# Patient Record
Sex: Male | Born: 1975 | Race: White | Hispanic: No | Marital: Married | Smoking: Never smoker
Health system: Southern US, Community
[De-identification: ages and names within clinical notes are randomized; demographics above are authoritative.]

## PROBLEM LIST (undated history)

## (undated) DIAGNOSIS — G43909 Migraine, unspecified, not intractable, without status migrainosus: Secondary | ICD-10-CM

## (undated) DIAGNOSIS — E785 Hyperlipidemia, unspecified: Secondary | ICD-10-CM

## (undated) DIAGNOSIS — F419 Anxiety disorder, unspecified: Secondary | ICD-10-CM

## (undated) HISTORY — PX: COLONOSCOPY: SHX174

## (undated) HISTORY — DX: Hyperlipidemia, unspecified: E78.5

## (undated) HISTORY — DX: Anxiety disorder, unspecified: F41.9

## (undated) HISTORY — DX: Migraine, unspecified, not intractable, without status migrainosus: G43.909

---

## 2007-07-29 HISTORY — PX: CARDIAC CATHETERIZATION: SHX172

## 2013-01-07 ENCOUNTER — Ambulatory Visit (INDEPENDENT_AMBULATORY_CARE_PROVIDER_SITE_OTHER): Payer: 59 | Admitting: Internal Medicine

## 2013-01-07 ENCOUNTER — Encounter: Payer: Self-pay | Admitting: Internal Medicine

## 2013-01-07 VITALS — BP 110/70 | HR 61 | Temp 98.4°F | Resp 20 | Ht 73.5 in | Wt 236.0 lb

## 2013-01-07 DIAGNOSIS — Z0000 Encounter for general adult medical examination without abnormal findings: Secondary | ICD-10-CM

## 2013-01-07 MED ORDER — PROPRANOLOL HCL 20 MG PO TABS: ORAL_TABLET | ORAL | Status: DC

## 2013-01-07 MED ORDER — ALPRAZOLAM 0.5 MG PO TABS: 0.5000 mg | ORAL_TABLET | Freq: Every evening | ORAL | Status: DC | PRN

## 2013-01-07 NOTE — Progress Notes (Signed)
Subjective:    Patient ID: Edward Nash, male    DOB: March 15, 1976, 37 y.o.   MRN: [id number]  HPI  37 year old patient who is seen today to establish with our practice. He has enjoyed excellent health and takes no chronic medications.  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 In 2009 he was living in [place]  for 3 years and had a cardiac evaluation included a cardiac catheterization. This was normal. In April of 2013 the patient had an EKG that revealed inverted T waves in the lateral precordial leads. This was evaluated with a stress echo with normal findings. The patient remains reactive including full court basketball without limitations.  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 He was born in [place] but moved to [place] at [age] year of age. His parents are retired in [place].  Family history both parents are age 103 and in good health mother has hypercholesterolemia. 2 brothers age 36 and 66 in good health    Review of Systems  Constitutional: Negative for fever, chills, activity change, appetite change and fatigue.  HENT: Negative for hearing loss, ear pain, congestion, rhinorrhea, sneezing, mouth sores, trouble swallowing, neck pain, neck stiffness, dental problem, voice change, sinus pressure and tinnitus.   Eyes: Negative for photophobia, pain, redness and visual disturbance.  Respiratory: Negative for apnea, cough, choking, chest tightness, shortness of breath and wheezing.   Cardiovascular: Negative for chest pain, palpitations and leg swelling.  Gastrointestinal: Negative for nausea, vomiting, abdominal pain, diarrhea, constipation, blood in stool, abdominal distention, anal bleeding and rectal pain.  Genitourinary: Negative for dysuria, urgency, frequency, hematuria, flank pain, decreased urine volume, discharge, penile swelling, scrotal swelling, difficulty urinating, genital sores and testicular pain.   Musculoskeletal: Negative for myalgias, back pain, joint swelling, arthralgias and gait problem.  Skin: Negative for color change, rash and wound.  Neurological: Negative for dizziness, tremors, seizures, syncope, facial asymmetry, speech difficulty, weakness, light-headedness, numbness and headaches.  Hematological: Negative for adenopathy. Does not bruise/bleed easily.  Psychiatric/Behavioral: Negative for suicidal ideas, hallucinations, behavioral problems, confusion, sleep disturbance, self-injury, dysphoric mood, decreased concentration and agitation. The patient is not nervous/anxious.        Objective:   Physical Exam  Constitutional: He appears well-developed and well-nourished.  HENT:  Head: Normocephalic and atraumatic.  Right Ear: External ear normal.  Left Ear: External ear normal.  Nose: Nose normal.  Mouth/Throat: Oropharynx is clear and moist.  Eyes: Conjunctivae and EOM are normal. Pupils are equal, round, and reactive to light. No scleral icterus.  Neck: Normal range of motion. Neck supple. No JVD present. No thyromegaly present.  Cardiovascular: Regular rhythm, normal heart sounds and intact distal pulses.  Exam reveals no gallop and no friction rub.   No murmur heard. Pulmonary/Chest: Effort normal and breath sounds normal. He exhibits no tenderness.  Abdominal: Soft. Bowel sounds are normal. He exhibits no distension and no mass. There is no tenderness.  Genitourinary: Prostate normal and penis normal.  Musculoskeletal: Normal range of motion. He exhibits no edema and no tenderness.  Lymphadenopathy:    He has no cervical adenopathy.  Neurological: He is alert. He has normal reflexes. No cranial nerve deficit. Coordination normal.  Skin: Skin is warm and dry. No rash noted.  Psychiatric: He has a normal mood and affect. His behavior is normal.          Assessment & Plan:   Preventive health examination. Performance anxiety. The patient has  used alprazolam  as needed in the past. Will refill this prescription. The patient will also give a call of propranolol  Return here in one or 2 years or as needed

## 2013-01-07 NOTE — Patient Instructions (Addendum)
It is important that you exercise regularly, at least 20 minutes 3 to 4 times per week.  If you develop chest pain or shortness of breath seek  medical attention.Preventive Care for Adults, Male A healthy lifestyle and preventive care can promote health and wellness. Preventive health guidelines for men include the following key practices:  A routine yearly physical is a good way to check with your caregiver about your health and preventative screening. It is a chance to share any concerns and updates on your health, and to receive a thorough exam.  Visit your dentist for a routine exam and preventative care every 6 months. Brush your teeth twice a day and floss once a day. Good oral hygiene prevents tooth decay and gum disease.  The frequency of eye exams is based on your age, health, family medical history, use of contact lenses, and other factors. Follow your caregiver's recommendations for frequency of eye exams.  Eat a healthy diet. Foods like vegetables, fruits, whole grains, low-fat dairy products, and lean protein foods contain the nutrients you need without too many calories. Decrease your intake of foods high in solid fats, added sugars, and salt. Eat the right amount of calories for you.Get information about a proper diet from your caregiver, if necessary.  Regular physical exercise is one of the most important things you can do for your health. Most adults should get at least 150 minutes of moderate-intensity exercise (any activity that increases your heart rate and causes you to sweat) each week. In addition, most adults need muscle-strengthening exercises on 2 or more days a week.  Maintain a healthy weight. The body mass index (BMI) is a screening tool to identify possible weight problems. It provides an estimate of body fat based on height and weight. Your caregiver can help determine your BMI, and can help you achieve or maintain a healthy weight.For adults 20 years and older:  A  BMI below 18.5 is considered underweight.  A BMI of 18.5 to 24.9 is normal.  A BMI of 25 to 29.9 is considered overweight.  A BMI of 30 and above is considered obese.  Maintain normal blood lipids and cholesterol levels by exercising and minimizing your intake of saturated fat. Eat a balanced diet with plenty of fruit and vegetables. Blood tests for lipids and cholesterol should begin at age 2 and be repeated every 5 years. If your lipid or cholesterol levels are high, you are over 50, or you are a high risk for heart disease, you may need your cholesterol levels checked more frequently.Ongoing high lipid and cholesterol levels should be treated with medicines if diet and exercise are not effective.  If you smoke, find out from your caregiver how to quit. If you do not use tobacco, do not start.  If you choose to drink alcohol, do not exceed 2 drinks per day. One drink is considered to be 12 ounces (355 mL) of beer, 5 ounces (148 mL) of wine, or 1.5 ounces (44 mL) of liquor.  Avoid use of street drugs. Do not share needles with anyone. Ask for help if you need support or instructions about stopping the use of drugs.  High blood pressure causes heart disease and increases the risk of stroke. Your blood pressure should be checked at least every 1 to 2 years. Ongoing high blood pressure should be treated with medicines, if weight loss and exercise are not effective.  If you are 43 to 39 years old, ask your caregiver if you  should take aspirin to prevent heart disease.  Diabetes screening involves taking a blood sample to check your fasting blood sugar level. This should be done once every 3 years, after age 29, if you are within normal weight and without risk factors for diabetes. Testing should be considered at a younger age or be carried out more frequently if you are overweight and have at least 1 risk factor for diabetes.  Colorectal cancer can be detected and often prevented. Most routine  colorectal cancer screening begins at the age of 77 and continues through age 28. However, your caregiver may recommend screening at an earlier age if you have risk factors for colon cancer. On a yearly basis, your caregiver may provide home test kits to check for hidden blood in the stool. Use of a small camera at the end of a tube, to directly examine the colon (sigmoidoscopy or colonoscopy), can detect the earliest forms of colorectal cancer. Talk to your caregiver about this at age 75, when routine screening begins. Direct examination of the colon should be repeated every 5 to 10 years through age 54, unless early forms of pre-cancerous polyps or small growths are found.  Hepatitis C blood testing is recommended for all people born from [birth year] through 1965 and any individual with known risks for hepatitis C.  Practice safe sex. Use condoms and avoid high-risk sexual practices to reduce the spread of sexually transmitted infections (STIs). STIs include gonorrhea, chlamydia, syphilis, trichomonas, herpes, HPV, and human immunodeficiency virus (HIV). Herpes, HIV, and HPV are viral illnesses that have no cure. They can result in disability, cancer, and death.  A one-time screening for abdominal aortic aneurysm (AAA) and surgical repair of large AAAs by sound wave imaging (ultrasonography) is recommended for ages 29 to 71 years who are current or former smokers.  Healthy men should no longer receive prostate-specific antigen (PSA) blood tests as part of routine cancer screening. Consult with your caregiver about prostate cancer screening.  Testicular cancer screening is not recommended for adult males who have no symptoms. Screening includes self-exam, caregiver exam, and other screening tests. Consult with your caregiver about any symptoms you have or any concerns you have about testicular cancer.  Use sunscreen with skin protection factor (SPF) of 30 or more. Apply sunscreen liberally and repeatedly  throughout the day. You should seek shade when your shadow is shorter than you. Protect yourself by wearing long sleeves, pants, a wide-brimmed hat, and sunglasses year round, whenever you are outdoors.  Once a month, do a whole body skin exam, using a mirror to look at the skin on your back. Notify your caregiver of new moles, moles that have irregular borders, moles that are larger than a pencil eraser, or moles that have changed in shape or color.  Stay current with required immunizations.  Influenza. You need a dose every fall (or winter). The composition of the flu vaccine changes each year, so being vaccinated once is not enough.  Pneumococcal polysaccharide. You need 1 to 2 doses if you smoke cigarettes or if you have certain chronic medical conditions. You need 1 dose at age 36 (or older) if you have never been vaccinated.  Tetanus, diphtheria, pertussis (Tdap, Td). Get 1 dose of Tdap vaccine if you are younger than age 44 years, are over 32 and have contact with an infant, are a Research scientist (physical sciences), or simply want to be protected from whooping cough. After that, you need a Td booster dose every 10 years. Consult your  caregiver if you have not had at least 3 tetanus and diphtheria-containing shots sometime in your life or have a deep or dirty wound.  HPV. This vaccine is recommended for males 13 through 39 years of age. This vaccine may be given to men 22 through 39 years of age who have not completed the 3 dose series. It is recommended for men through age 63 who have sex with men or whose immune system is weakened because of HIV infection, other illness, or medications. The vaccine is given in 3 doses over 6 months.  Measles, mumps, rubella (MMR). You need at least 1 dose of MMR if you were born in 1957 or later. You may also need a 2nd dose.  Meningococcal. If you are age 3 to 46 years and a Orthoptist living in a residence hall, or have one of several medical conditions,  you need to get vaccinated against meningococcal disease. You may also need additional booster doses.  Zoster (shingles). If you are age 31 years or older, you should get this vaccine.  Varicella (chickenpox). If you have never had chickenpox or you were vaccinated but received only 1 dose, talk to your caregiver to find out if you need this vaccine.  Hepatitis A. You need this vaccine if you have a specific risk factor for hepatitis A virus infection, or you simply wish to be protected from this disease. The vaccine is usually given as 2 doses, 6 to 18 months apart.  Hepatitis B. You need this vaccine if you have a specific risk factor for hepatitis B virus infection or you simply wish to be protected from this disease. The vaccine is given in 3 doses, usually over 6 months. Preventative Service / Frequency Ages 36 to 84  Blood pressure check.** / Every 1 to 2 years.  Lipid and cholesterol check.** / Every 5 years beginning at age 28.  Hepatitis C blood test.** / For any individual with known risks for hepatitis C.  Skin self-exam. / Monthly.  Influenza immunization.** / Every year.  Pneumococcal polysaccharide immunization.** / 1 to 2 doses if you smoke cigarettes or if you have certain chronic medical conditions.  Tetanus, diphtheria, pertussis (Tdap,Td) immunization. / A one-time dose of Tdap vaccine. After that, you need a Td booster dose every 10 years.  HPV immunization. / 3 doses over 6 months, if 26 and younger.  Measles, mumps, rubella (MMR) immunization. / You need at least 1 dose of MMR if you were born in 1957 or later. You may also need a 2nd dose.  Meningococcal immunization. / 1 dose if you are age 37 to 74 years and a Orthoptist living in a residence hall, or have one of several medical conditions, you need to get vaccinated against meningococcal disease. You may also need additional booster doses.  Varicella immunization.** / Consult your  caregiver.  Hepatitis A immunization.** / Consult your caregiver. 2 doses, 6 to 18 months apart.  Hepatitis B immunization.** / Consult your caregiver. 3 doses usually over 6 months. Ages 39 to 62  Blood pressure check.** / Every 1 to 2 years.  Lipid and cholesterol check.** / Every 5 years beginning at age 71.  Fecal occult blood test (FOBT) of stool. / Every year beginning at age 34 and continuing until age 75. You may not have to do this test if you get colonoscopy every 10 years.  Flexible sigmoidoscopy** or colonoscopy.** / Every 5 years for a flexible sigmoidoscopy or every  10 years for a colonoscopy beginning at age 3 and continuing until age 33.  Hepatitis C blood test.** / For all people born from [birth year] through 1965 and any individual with known risks for hepatitis C.  Skin self-exam. / Monthly.  Influenza immunization.** / Every year.  Pneumococcal polysaccharide immunization.** / 1 to 2 doses if you smoke cigarettes or if you have certain chronic medical conditions.  Tetanus, diphtheria, pertussis (Tdap/Td) immunization.** / A one-time dose of Tdap vaccine. After that, you need a Td booster dose every 10 years.  Measles, mumps, rubella (MMR) immunization. / You need at least 1 dose of MMR if you were born in 1957 or later. You may also need a 2nd dose.  Varicella immunization.**/ Consult your caregiver.  Meningococcal immunization.** / Consult your caregiver.  Hepatitis A immunization.** / Consult your caregiver. 2 doses, 6 to 18 months apart.  Hepatitis B immunization.** / Consult your caregiver. 3 doses, usually over 6 months. Ages 4 and over  Blood pressure check.** / Every 1 to 2 years.  Lipid and cholesterol check.**/ Every 5 years beginning at age 32.  Fecal occult blood test (FOBT) of stool. / Every year beginning at age 53 and continuing until age 70. You may not have to do this test if you get colonoscopy every 10 years.  Flexible sigmoidoscopy** or  colonoscopy.** / Every 5 years for a flexible sigmoidoscopy or every 10 years for a colonoscopy beginning at age 40 and continuing until age 65.  Hepatitis C blood test.** / For all people born from [birth year] through 1965 and any individual with known risks for hepatitis C.  Abdominal aortic aneurysm (AAA) screening.** / A one-time screening for ages 50 to 8 years who are current or former smokers.  Skin self-exam. / Monthly.  Influenza immunization.** / Every year.  Pneumococcal polysaccharide immunization.** / 1 dose at age 84 (or older) if you have never been vaccinated.  Tetanus, diphtheria, pertussis (Tdap, Td) immunization. / A one-time dose of Tdap vaccine if you are over 65 and have contact with an infant, are a Research scientist (physical sciences), or simply want to be protected from whooping cough. After that, you need a Td booster dose every 10 years.  Varicella immunization. ** / Consult your caregiver.  Meningococcal immunization.** / Consult your caregiver.  Hepatitis A immunization. ** / Consult your caregiver. 2 doses, 6 to 18 months apart.  Hepatitis B immunization.** / Check with your caregiver. 3 doses, usually over 6 months. **Family history and personal history of risk and conditions may change your caregiver's recommendations. Document Released: 09/09/2001 Document Revised: 10/06/2011 Document Reviewed: 12/09/2010 [facility] Patient Information 2014 [place], [place].

## 2013-08-19 ENCOUNTER — Encounter: Payer: Self-pay | Admitting: Internal Medicine

## 2013-08-19 ENCOUNTER — Ambulatory Visit (INDEPENDENT_AMBULATORY_CARE_PROVIDER_SITE_OTHER): Payer: BC Managed Care – PPO | Admitting: Internal Medicine

## 2013-08-19 VITALS — BP 120/80 | HR 64 | Temp 98.8°F | Resp 20 | Ht 73.5 in | Wt 235.0 lb

## 2013-08-19 DIAGNOSIS — E785 Hyperlipidemia, unspecified: Secondary | ICD-10-CM

## 2013-08-19 DIAGNOSIS — Z23 Encounter for immunization: Secondary | ICD-10-CM

## 2013-08-19 DIAGNOSIS — B009 Herpesviral infection, unspecified: Secondary | ICD-10-CM

## 2013-08-19 LAB — LIPID PANEL
Cholesterol: 210 mg/dL — ABNORMAL HIGH (ref 0–200)
HDL: 51.7 mg/dL (ref >39.00)
Total CHOL/HDL Ratio: 4
Triglycerides: 78 mg/dL (ref 0.0–149.0)
VLDL: 15.6 mg/dL (ref 0.0–40.0)

## 2013-08-19 LAB — LDL CHOLESTEROL, DIRECT: Direct LDL: 145.6 mg/dL

## 2013-08-19 MED ORDER — PROPRANOLOL HCL 20 MG PO TABS: ORAL_TABLET | ORAL | Status: DC

## 2013-08-19 NOTE — Patient Instructions (Signed)
It is important that you exercise regularly, at least 20 minutes 3 to 4 times per week.  If you develop chest pain or shortness of breath seek  medical attention.  Call or return to clinic prn if these symptoms worsen or fail to improve as anticipated.  

## 2013-08-19 NOTE — Progress Notes (Signed)
   Subjective:    Patient ID: Edward Nash, male    DOB: 04/21/1976, 38 y.o.   MRN: [id number]  HPI  38 year old patient was concern about a possible herpetic lesion involving his right lower lateral lip. His wife is 8 months pregnant. No prior history of known herpetic lesions. He also has a history of mild dyslipidemia and is requesting followup.  Past Medical History  Diagnosis Date  . Migraines     History   Social History  . Marital Status: Married    Spouse Name: N/A    Number of Children: N/A  . Years of Education: N/A   Occupational History  . Not on file.   Social History Main Topics  . Smoking status: Never Smoker   . Smokeless tobacco: Former NeurosurgeonUser    Types: Chew  . Alcohol Use: 0.6 oz/week    1 Cans of beer per week  . Drug Use: No  . Sexual Activity: Not on file   Other Topics Concern  . Not on file   Social History Narrative  . No narrative on file    History reviewed. No pertinent past surgical history.  No family history on file.  Allergies  Allergen Reactions  . Sulfa Antibiotics Rash    Current Outpatient Prescriptions on File Prior to Visit  Medication Sig Dispense Refill  . acetaminophen (TYLENOL) 500 MG tablet Take 1,000 mg by mouth every 6 (six) hours as needed for pain.       No current facility-administered medications on file prior to visit.    BP 120/80  Pulse 64  Temp(Src) 98.8 F (37.1 C) (Oral)  Resp 20  Ht 6' 1.5" (1.867 m)  Wt 235 lb (106.595 kg)  BMI 30.58 kg/m2  SpO2 98%       Review of Systems  Skin: Positive for wound.       Objective:   Physical Exam  Constitutional: He appears well-developed and well-nourished. No distress.  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 The benefits and drawbacks of testing discussed. Dyslipidemia. We'll check a followup lipid profile.

## 2013-08-19 NOTE — Progress Notes (Signed)
Pre-visit discussion using our clinic review tool. No additional management support is needed unless otherwise documented below in the visit note.  

## 2013-08-20 ENCOUNTER — Other Ambulatory Visit: Payer: Self-pay | Admitting: Internal Medicine

## 2013-08-23 ENCOUNTER — Other Ambulatory Visit: Payer: Self-pay | Admitting: Internal Medicine

## 2013-08-23 NOTE — Telephone Encounter (Signed)
Dr.K, pt wanting HSV results.

## 2013-09-11 ENCOUNTER — Emergency Department (INDEPENDENT_AMBULATORY_CARE_PROVIDER_SITE_OTHER)
Admission: EM | Admit: 2013-09-11 | Discharge: 2013-09-11 | Disposition: A | Discharge status: Home / Self Care | Payer: BC Managed Care – PPO | Source: Home / Self Care | Attending: Family Medicine | Admitting: Family Medicine

## 2013-09-11 ENCOUNTER — Emergency Department (HOSPITAL_COMMUNITY)
Admission: EM | Admit: 2013-09-11 | Discharge: 2013-09-11 | Disposition: A | Discharge status: Home / Self Care | Payer: BC Managed Care – PPO | Source: Home / Self Care | Attending: Family Medicine | Admitting: Family Medicine

## 2013-09-11 ENCOUNTER — Encounter (HOSPITAL_COMMUNITY): Payer: Self-pay | Admitting: Emergency Medicine

## 2013-09-11 DIAGNOSIS — G43909 Migraine, unspecified, not intractable, without status migrainosus: Secondary | ICD-10-CM

## 2013-09-11 MED ORDER — SUMATRIPTAN SUCCINATE 6 MG/0.5ML ~~LOC~~ SOLN
6.0000 mg | Freq: Once | SUBCUTANEOUS | Status: AC
  Administered 2013-09-11: 6 mg via SUBCUTANEOUS

## 2013-09-11 MED ORDER — KETOROLAC TROMETHAMINE 60 MG/2ML IM SOLN
60.0000 mg | Freq: Once | INTRAMUSCULAR | Status: AC
  Administered 2013-09-11: 60 mg via INTRAMUSCULAR

## 2013-09-11 MED ORDER — DEXAMETHASONE SODIUM PHOSPHATE 10 MG/ML IJ SOLN
10.0000 mg | Freq: Once | INTRAMUSCULAR | Status: AC
  Administered 2013-09-11: 10 mg via INTRAMUSCULAR

## 2013-09-11 MED ORDER — METOCLOPRAMIDE HCL 5 MG/ML IJ SOLN
5.0000 mg | Freq: Once | INTRAMUSCULAR | Status: AC
  Administered 2013-09-11: 5 mg via INTRAMUSCULAR

## 2013-09-11 MED ORDER — DEXAMETHASONE SODIUM PHOSPHATE 10 MG/ML IJ SOLN
INTRAMUSCULAR | Status: AC
  Filled 2013-09-11: qty 1

## 2013-09-11 MED ORDER — SUMATRIPTAN SUCCINATE 6 MG/0.5ML ~~LOC~~ SOLN
SUBCUTANEOUS | Status: AC
  Filled 2013-09-11: qty 0.5

## 2013-09-11 MED ORDER — KETOROLAC TROMETHAMINE 60 MG/2ML IM SOLN
INTRAMUSCULAR | Status: AC
  Filled 2013-09-11: qty 2

## 2013-09-11 MED ORDER — METOCLOPRAMIDE HCL 5 MG/ML IJ SOLN
INTRAMUSCULAR | Status: AC
  Filled 2013-09-11: qty 2

## 2013-09-11 NOTE — ED Notes (Signed)
C/o migraine States he usually get migraines 3-4 times a year States he would like a shot of medication

## 2013-09-11 NOTE — Discharge Instructions (Signed)
Thank you for coming in today. Follow up with your doctor.  Go to the emergency room if your headache becomes excruciating or you have weakness or numbness or uncontrolled vomiting.   Migraine Headache A migraine headache is an intense, throbbing pain on one or both sides of your head. A migraine can last for 30 minutes to several hours. CAUSES  The exact cause of a migraine headache is not always known. However, a migraine may be caused when nerves in the brain become irritated and release chemicals that cause inflammation. This causes pain. Certain things may also trigger migraines, such as:  Alcohol.  Smoking.  Stress.  Menstruation.  Aged cheeses.  Foods or drinks that contain nitrates, glutamate, aspartame, or tyramine.  Lack of sleep.  Chocolate.  Caffeine.  Hunger.  Physical exertion.  Fatigue.  Medicines used to treat chest pain (nitroglycerine), birth control pills, estrogen, and some blood pressure medicines. SIGNS AND SYMPTOMS  Pain on one or both sides of your head.  Pulsating or throbbing pain.  Severe pain that prevents daily activities.  Pain that is aggravated by any physical activity.  Nausea, vomiting, or both.  Dizziness.  Pain with exposure to bright lights, loud noises, or activity.  General sensitivity to bright lights, loud noises, or smells. Before you get a migraine, you may get warning signs that a migraine is coming (aura). An aura may include:  Seeing flashing lights.  Seeing bright spots, halos, or zig-zag lines.  Having tunnel vision or blurred vision.  Having feelings of numbness or tingling.  Having trouble talking.  Having muscle weakness. DIAGNOSIS  A migraine headache is often diagnosed based on:  Symptoms.  Physical exam.  A CT scan or MRI of your head. These imaging tests cannot diagnose migraines, but they can help rule out other causes of headaches. TREATMENT Medicines may be given for pain and nausea.  Medicines can also be given to help prevent recurrent migraines.  HOME CARE INSTRUCTIONS  Only take over-the-counter or prescription medicines for pain or discomfort as directed by your health care provider. The use of long-term narcotics is not recommended.  Lie down in a dark, quiet room when you have a migraine.  Keep a journal to find out what may trigger your migraine headaches. For example, write down:  What you eat and drink.  How much sleep you get.  Any change to your diet or medicines.  Limit alcohol consumption.  Quit smoking if you smoke.  Get 7 9 hours of sleep, or as recommended by your health care provider.  Limit stress.  Keep lights dim if bright lights bother you and make your migraines worse. SEEK IMMEDIATE MEDICAL CARE IF:   Your migraine becomes severe.  You have a fever.  You have a stiff neck.  You have vision loss.  You have muscular weakness or loss of muscle control.  You start losing your balance or have trouble walking.  You feel faint or pass out.  You have severe symptoms that are different from your first symptoms. MAKE SURE YOU:   Understand these instructions.  Will watch your condition.  Will get help right away if you are not doing well or get worse. Document Released: 07/14/2005 Document Revised: 05/04/2013 Document Reviewed: 03/21/2013 [facility] Patient Information 2014 [place], [place].

## 2013-09-11 NOTE — ED Provider Notes (Signed)
Edward Nash is a 38 y.o. male who presents to Urgent Care today for migraine headache. Patient has a history of migraines. He developed headache today about 5 PM. Initial visual aura now he notes right-sided pounding headache. This is consistent with prior episodes of migraine. He denies any weakness numbness difficulty walking or dizziness. He has had injections for migraines in the past and would like one of possible. He has not tried any medications yet. His pain is moderate. He typically gets migraines 3-4 times a year.   Past Medical History  Diagnosis Date  . Migraines    History  Substance Use Topics  . Smoking status: Never Smoker   . Smokeless tobacco: Former NeurosurgeonUser    Types: Chew  . Alcohol Use: 0.6 oz/week    1 Cans of beer per week   ROS as above Medications: Current Facility-Administered Medications  Medication Dose Route Frequency Provider Last Rate Last Dose  . dexamethasone (DECADRON) injection 10 mg  10 mg Intramuscular Once Rodolph BongEvan S Devun Anna, MD      . ketorolac (TORADOL) injection 60 mg  60 mg Intramuscular Once Rodolph BongEvan S Lynsi Dooner, MD      . metoCLOPramide (REGLAN) injection 5 mg  5 mg Intramuscular Once Rodolph BongEvan S Dujuan Stankowski, MD      . SUMAtriptan (IMITREX) injection 6 mg  6 mg Subcutaneous Once Rodolph BongEvan S Gracelin Weisberg, MD       Current Outpatient Prescriptions  Medication Sig Dispense Refill  . acetaminophen (TYLENOL) 500 MG tablet Take 1,000 mg by mouth every 6 (six) hours as needed for pain.      Marland Kitchen. propranolol (INDERAL) 20 MG tablet TAKE 1 TABLET BY MOUTH EVERY DAY AS DIRECTED  90 tablet  1    Exam:  BP 120/80  Pulse 52  Temp(Src) 98.6 F (37 C) (Oral)  Resp 18  SpO2 100% Gen: Well NAD HEENT: EOMI,  MMM PERRLA Lungs: Normal work of breathing. CTABL Heart: RRR no MRG Abd: NABS, Soft. NT, ND Exts: Brisk capillary refill, warm and well perfused.  Neuro: Alert and oriented normal coordination balance sensation and gait   Assessment and Plan: 38 y.o. male with migraine headache.   Plan to treat with IM Toradol, dexamethasone, Reglan, and subcutaneous Imitrex.  Followup with primary care provider  Discussed warning signs or symptoms. Please see discharge instructions. Patient expresses understanding.    Rodolph BongEvan S Kassi Esteve, MD 09/11/13 [phone]

## 2013-10-20 ENCOUNTER — Telehealth: Payer: Self-pay | Admitting: Internal Medicine

## 2013-10-20 NOTE — Telephone Encounter (Signed)
Please give contact number for [facility]

## 2013-10-20 NOTE — Telephone Encounter (Signed)
Please advise 

## 2013-10-20 NOTE — Telephone Encounter (Signed)
Spoke to pt gave him number for [facility], told him he can contact them for an appointment. Pt verbalized understanding.

## 2013-10-20 NOTE — Telephone Encounter (Signed)
Pt would like a referral to anger management. Pt did not elaborate on situation. Pt has bcbs

## 2013-10-27 ENCOUNTER — Ambulatory Visit: Payer: BC Managed Care – PPO | Admitting: Licensed Clinical Social Worker

## 2013-10-31 ENCOUNTER — Telehealth: Payer: Self-pay | Admitting: Internal Medicine

## 2013-10-31 NOTE — Telephone Encounter (Signed)
[facility] - [place], [place] - [address] is requesting re-fill propranolol (INDERAL) 20 MG tablet

## 2013-11-01 MED ORDER — PROPRANOLOL HCL 20 MG PO TABS: ORAL_TABLET | ORAL | Status: DC

## 2013-11-01 NOTE — Telephone Encounter (Signed)
Rx sent 

## 2013-11-02 ENCOUNTER — Ambulatory Visit (INDEPENDENT_AMBULATORY_CARE_PROVIDER_SITE_OTHER): Payer: BC Managed Care – PPO | Admitting: Licensed Clinical Social Worker

## 2013-11-02 DIAGNOSIS — F331 Major depressive disorder, recurrent, moderate: Secondary | ICD-10-CM

## 2013-11-28 ENCOUNTER — Ambulatory Visit: Payer: BC Managed Care – PPO | Admitting: Licensed Clinical Social Worker

## 2013-12-01 ENCOUNTER — Encounter: Payer: Self-pay | Admitting: Internal Medicine

## 2013-12-01 ENCOUNTER — Ambulatory Visit (INDEPENDENT_AMBULATORY_CARE_PROVIDER_SITE_OTHER): Payer: BC Managed Care – PPO | Admitting: Licensed Clinical Social Worker

## 2013-12-01 ENCOUNTER — Encounter: Payer: Self-pay | Admitting: Licensed Clinical Social Worker

## 2013-12-01 VITALS — BP 120/78

## 2013-12-01 DIAGNOSIS — F4323 Adjustment disorder with mixed anxiety and depressed mood: Secondary | ICD-10-CM

## 2013-12-01 DIAGNOSIS — F331 Major depressive disorder, recurrent, moderate: Secondary | ICD-10-CM

## 2013-12-01 MED ORDER — SILDENAFIL CITRATE 100 MG PO TABS: 50.0000 mg | ORAL_TABLET | Freq: Every day | ORAL | Status: DC | PRN

## 2013-12-01 MED ORDER — SERTRALINE HCL 50 MG PO TABS
50.0000 mg | ORAL_TABLET | Freq: Every day | ORAL | Status: DC
Start: 2013-12-01 — End: 2013-12-02

## 2013-12-01 NOTE — Progress Notes (Signed)
   Subjective:    Patient ID: Edward Nash, Edward Nash    DOB: 09/24/1975, 38 y.o.   MRN: [id number]  HPI  38 year old patient who was seen today as a work in.  Patient was seen in followup by behavioral health and was referred for consideration of antidepressant therapy.  Patient states that approximately 6 months ago.  Patient was seen by a psychologist and considered for an antidepressant.  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  His main concerns regard to anger issues.  He feels that he is easily angered and has frequent outbursts that interfere with interpersonal relationships.  He describes some chronic anxiety.  He also describes some ED issues and at times.  The inability to maintain an erection    Review of Systems     Objective:   Physical Exam        Assessment & Plan:   Options discussed.  Will start on sertraline 50 mg daily.  We'll watch her closely for any worsening of sexual dysfunction.  We'll give a trial of Viagra.  Samples of Cialis also dispensed.  He states that he has had a testosterone level checked in the past and have  always been normal

## 2013-12-01 NOTE — Patient Instructions (Signed)
Return in 6 weeks for follow up

## 2013-12-02 ENCOUNTER — Telehealth: Payer: Self-pay | Admitting: Internal Medicine

## 2013-12-02 MED ORDER — SERTRALINE HCL 50 MG PO TABS: 50.0000 mg | ORAL_TABLET | Freq: Every day | ORAL | Status: DC

## 2013-12-02 NOTE — Telephone Encounter (Addendum)
Pt needs generic zoloft 50 mg #90 w.refills [facility]. [facility]

## 2013-12-02 NOTE — Telephone Encounter (Signed)
Done, pt aware.

## 2013-12-02 NOTE — Telephone Encounter (Signed)
Spoke to pt told him Rx for Zoloft was sent to [facility]. [facility]. Pt said yes. Told him [facility]. Pt verbalized understanding. Rx sent.

## 2013-12-15 ENCOUNTER — Ambulatory Visit: Payer: BC Managed Care – PPO | Admitting: Licensed Clinical Social Worker

## 2013-12-23 ENCOUNTER — Ambulatory Visit (INDEPENDENT_AMBULATORY_CARE_PROVIDER_SITE_OTHER): Payer: BC Managed Care – PPO | Admitting: Licensed Clinical Social Worker

## 2013-12-23 DIAGNOSIS — F331 Major depressive disorder, recurrent, moderate: Secondary | ICD-10-CM

## 2014-01-12 ENCOUNTER — Telehealth: Payer: Self-pay | Admitting: Internal Medicine

## 2014-01-12 NOTE — Telephone Encounter (Signed)
Dr. Kirtland BouchardK, please see message and advise if okay to increase Sertraline to 100 mg ?

## 2014-01-12 NOTE — Telephone Encounter (Signed)
Okay to increase sertraline to 100 mg daily.  Okay to call in new prescription

## 2014-01-12 NOTE — Telephone Encounter (Signed)
Pt req rx on sertraline (ZOLOFT) 50 MG tablet  He said Judithe ModestSusan Bond suggested he go to 100mg  and this is [facility] is [facility]  [address] [facility] [address]

## 2014-01-13 MED ORDER — SERTRALINE HCL 100 MG PO TABS: 100.0000 mg | ORAL_TABLET | Freq: Every day | ORAL | Status: DC

## 2014-01-13 NOTE — Telephone Encounter (Signed)
Spoke to pt told him sorry did not get My Chart message but okay to increase Sertraline to 100 mg. Pt verbalized understanding and asked [facility]. Told pt okay will send Rx. Rx sent.

## 2014-06-21 ENCOUNTER — Other Ambulatory Visit: Payer: Self-pay | Admitting: Internal Medicine

## 2014-07-25 ENCOUNTER — Other Ambulatory Visit: Payer: Self-pay | Admitting: *Deleted

## 2014-07-25 MED ORDER — SERTRALINE HCL 100 MG PO TABS: ORAL_TABLET | ORAL | Status: DC

## 2014-10-27 ENCOUNTER — Other Ambulatory Visit: Payer: Self-pay | Admitting: Internal Medicine

## 2014-12-22 ENCOUNTER — Other Ambulatory Visit: Payer: Self-pay | Admitting: Internal Medicine

## 2015-01-12 ENCOUNTER — Other Ambulatory Visit: Payer: Self-pay | Admitting: Internal Medicine

## 2015-02-16 ENCOUNTER — Other Ambulatory Visit: Payer: Self-pay | Admitting: Internal Medicine

## 2015-02-28 ENCOUNTER — Other Ambulatory Visit: Payer: Self-pay | Admitting: Internal Medicine

## 2015-02-28 NOTE — Telephone Encounter (Signed)
Pt request refill of the following: sertraline (ZOLOFT) 100 MG tablet  Pt has a physical scheduled for Sept 2016   [facility]  [address] [place]

## 2015-03-01 MED ORDER — SERTRALINE HCL 100 MG PO TABS: ORAL_TABLET | ORAL | Status: DC

## 2015-03-01 NOTE — Telephone Encounter (Signed)
Rx sent 

## 2015-04-19 ENCOUNTER — Other Ambulatory Visit (INDEPENDENT_AMBULATORY_CARE_PROVIDER_SITE_OTHER): Payer: BLUE CROSS/BLUE SHIELD

## 2015-04-19 DIAGNOSIS — Z0000 Encounter for general adult medical examination without abnormal findings: Secondary | ICD-10-CM

## 2015-04-19 LAB — BASIC METABOLIC PANEL
BUN: 17 mg/dL (ref 6–23)
CO2: 31 mEq/L (ref 19–32)
Calcium: 9.7 mg/dL (ref 8.4–10.5)
Chloride: 104 mEq/L (ref 96–112)
Creatinine, Ser: 0.87 mg/dL (ref 0.40–1.50)
GFR: 102.76 mL/min (ref >60.00)
Glucose, Bld: 100 mg/dL — ABNORMAL HIGH (ref 70–99)
Potassium: 4.6 mEq/L (ref 3.5–5.1)
Sodium: 142 mEq/L (ref 135–145)

## 2015-04-19 LAB — POCT URINALYSIS DIPSTICK
Bilirubin, UA: NEGATIVE
Blood, UA: NEGATIVE
Glucose, UA: NEGATIVE
Ketones, UA: NEGATIVE
Leukocytes, UA: NEGATIVE
Nitrite, UA: NEGATIVE
Protein, UA: NEGATIVE
Spec Grav, UA: 1.025
Urobilinogen, UA: 0.2
pH, UA: 6.5

## 2015-04-19 LAB — CBC WITH DIFFERENTIAL/PLATELET
Basophils Absolute: 0 10*3/uL (ref 0.0–0.1)
Basophils Relative: 0.6 % (ref 0.0–3.0)
Eosinophils Absolute: 0.1 10*3/uL (ref 0.0–0.7)
Eosinophils Relative: 1.5 % (ref 0.0–5.0)
HCT: 44.1 % (ref 39.0–52.0)
Hemoglobin: 15 g/dL (ref 13.0–17.0)
Lymphocytes Relative: 23.6 % (ref 12.0–46.0)
Lymphs Abs: 1.5 10*3/uL (ref 0.7–4.0)
MCHC: 34 g/dL (ref 30.0–36.0)
MCV: 88.4 fl (ref 78.0–100.0)
Monocytes Absolute: 0.5 10*3/uL (ref 0.1–1.0)
Monocytes Relative: 7.4 % (ref 3.0–12.0)
Neutro Abs: 4.3 10*3/uL (ref 1.4–7.7)
Neutrophils Relative %: 66.9 % (ref 43.0–77.0)
Platelets: 196 10*3/uL (ref 150.0–400.0)
RBC: 4.99 Mil/uL (ref 4.22–5.81)
RDW: 13.1 % (ref 11.5–15.5)
WBC: 6.5 10*3/uL (ref 4.0–10.5)

## 2015-04-19 LAB — HEPATIC FUNCTION PANEL
ALT: 18 U/L (ref 0–53)
AST: 17 U/L (ref 0–37)
Albumin: 4.4 g/dL (ref 3.5–5.2)
Alkaline Phosphatase: 42 U/L (ref 39–117)
Bilirubin, Direct: 0.1 mg/dL (ref 0.0–0.3)
Total Bilirubin: 0.5 mg/dL (ref 0.2–1.2)
Total Protein: 7 g/dL (ref 6.0–8.3)

## 2015-04-19 LAB — LIPID PANEL
Cholesterol: 202 mg/dL — ABNORMAL HIGH (ref 0–200)
HDL: 56.9 mg/dL (ref >39.00)
LDL Cholesterol: 128 mg/dL — ABNORMAL HIGH (ref 0–99)
NonHDL: 144.95
Total CHOL/HDL Ratio: 4
Triglycerides: 87 mg/dL (ref 0.0–149.0)
VLDL: 17.4 mg/dL (ref 0.0–40.0)

## 2015-04-19 LAB — TSH: TSH: 1.56 u[IU]/mL (ref 0.35–4.50)

## 2015-04-23 ENCOUNTER — Encounter: Payer: Self-pay | Admitting: Internal Medicine

## 2015-04-23 ENCOUNTER — Ambulatory Visit (INDEPENDENT_AMBULATORY_CARE_PROVIDER_SITE_OTHER): Payer: BLUE CROSS/BLUE SHIELD | Admitting: Internal Medicine

## 2015-04-23 VITALS — BP 120/80 | HR 72 | Temp 99.0°F | Resp 20 | Ht 73.5 in | Wt 240.0 lb

## 2015-04-23 DIAGNOSIS — Z0000 Encounter for general adult medical examination without abnormal findings: Secondary | ICD-10-CM | POA: Diagnosis not present

## 2015-04-23 NOTE — Patient Instructions (Signed)
Health Maintenance A healthy lifestyle and preventative care can promote health and wellness.  Maintain regular health, dental, and eye exams.  Eat a healthy diet. Foods like vegetables, fruits, whole grains, low-fat dairy products, and lean protein foods contain the nutrients you need and are low in calories. Decrease your intake of foods high in solid fats, added sugars, and salt. Get information about a proper diet from your health care Umaiza Matusik, if necessary.  Regular physical exercise is one of the most important things you can do for your health. Most adults should get at least 150 minutes of moderate-intensity exercise (any activity that increases your heart rate and causes you to sweat) each week. In addition, most adults need muscle-strengthening exercises on 2 or more days a week.   Maintain a healthy weight. The body mass index (BMI) is a screening tool to identify possible weight problems. It provides an estimate of body fat based on height and weight. Your health care Anothy Bufano can find your BMI and can help you achieve or maintain a healthy weight. For males 20 years and older:  A BMI below 18.5 is considered underweight.  A BMI of 18.5 to 24.9 is normal.  A BMI of 25 to 29.9 is considered overweight.  A BMI of 30 and above is considered obese.  Maintain normal blood lipids and cholesterol by exercising and minimizing your intake of saturated fat. Eat a balanced diet with plenty of fruits and vegetables. Blood tests for lipids and cholesterol should begin at age 20 and be repeated every 5 years. If your lipid or cholesterol levels are high, you are over age 50, or you are at high risk for heart disease, you may need your cholesterol levels checked more frequently.Ongoing high lipid and cholesterol levels should be treated with medicines if diet and exercise are not working.  If you smoke, find out from your health care Lilleigh Hechavarria how to quit. If you do not use tobacco, do not  start.  Lung cancer screening is recommended for adults aged 55-80 years who are at high risk for developing lung cancer because of a history of smoking. A yearly low-dose CT scan of the lungs is recommended for people who have at least a 30-pack-year history of smoking and are current smokers or have quit within the past 15 years. A pack year of smoking is smoking an average of 1 pack of cigarettes a day for 1 year (for example, a 30-pack-year history of smoking could mean smoking 1 pack a day for 30 years or 2 packs a day for 15 years). Yearly screening should continue until the smoker has stopped smoking for at least 15 years. Yearly screening should be stopped for people who develop a health problem that would prevent them from having lung cancer treatment.  If you choose to drink alcohol, do not have more than 2 drinks per day. One drink is considered to be 12 oz (360 mL) of beer, 5 oz (150 mL) of wine, or 1.5 oz (45 mL) of liquor.  Avoid the use of street drugs. Do not share needles with anyone. Ask for help if you need support or instructions about stopping the use of drugs.  High blood pressure causes heart disease and increases the risk of stroke. Blood pressure should be checked at least every 1-2 years. Ongoing high blood pressure should be treated with medicines if weight loss and exercise are not effective.  If you are 45-79 years old, ask your health care Gionni Vaca if   you should take aspirin to prevent heart disease.  Diabetes screening involves taking a blood sample to check your fasting blood sugar level. This should be done once every 3 years after age 45 if you are at a normal weight and without risk factors for diabetes. Testing should be considered at a younger age or be carried out more frequently if you are overweight and have at least 1 risk factor for diabetes.  Colorectal cancer can be detected and often prevented. Most routine colorectal cancer screening begins at the age of 50  and continues through age 75. However, your health care Walfred Bettendorf may recommend screening at an earlier age if you have risk factors for colon cancer. On a yearly basis, your health care Julene Rahn may provide home test kits to check for hidden blood in the stool. A small camera at the end of a tube may be used to directly examine the colon (sigmoidoscopy or colonoscopy) to detect the earliest forms of colorectal cancer. Talk to your health care Lang Zingg about this at age 50 when routine screening begins. A direct exam of the colon should be repeated every 5-10 years through age 75, unless early forms of precancerous polyps or small growths are found.  People who are at an increased risk for hepatitis B should be screened for this virus. You are considered at high risk for hepatitis B if:  You were born in a country where hepatitis B occurs often. Talk with your health care Taylie Helder about which countries are considered high risk.  Your parents were born in a high-risk country and you have not received a shot to protect against hepatitis B (hepatitis B vaccine).  You have HIV or AIDS.  You use needles to inject street drugs.  You live with, or have sex with, someone who has hepatitis B.  You are a man who has sex with other men (MSM).  You get hemodialysis treatment.  You take certain medicines for conditions like cancer, organ transplantation, and autoimmune conditions.  Hepatitis C blood testing is recommended for all people born from 1945 through 1965 and any individual with known risk factors for hepatitis C.  Healthy men should no longer receive prostate-specific antigen (PSA) blood tests as part of routine cancer screening. Talk to your health care Daylen Hack about prostate cancer screening.  Testicular cancer screening is not recommended for adolescents or adult males who have no symptoms. Screening includes self-exam, a health care Jora Galluzzo exam, and other screening tests. Consult with your  health care Makita Blow about any symptoms you have or any concerns you have about testicular cancer.  Practice safe sex. Use condoms and avoid high-risk sexual practices to reduce the spread of sexually transmitted infections (STIs).  You should be screened for STIs, including gonorrhea and chlamydia if:  You are sexually active and are younger than 24 years.  You are older than 24 years, and your health care Aryanna Shaver tells you that you are at risk for this type of infection.  Your sexual activity has changed since you were last screened, and you are at an increased risk for chlamydia or gonorrhea. Ask your health care Donnamae Muilenburg if you are at risk.  If you are at risk of being infected with HIV, it is recommended that you take a prescription medicine daily to prevent HIV infection. This is called pre-exposure prophylaxis (PrEP). You are considered at risk if:  You are a man who has sex with other men (MSM).  You are a heterosexual man who   is sexually active with multiple partners.  You take drugs by injection.  You are sexually active with a partner who has HIV.  Talk with your health care Leverne Amrhein about whether you are at high risk of being infected with HIV. If you choose to begin PrEP, you should first be tested for HIV. You should then be tested every 3 months for as long as you are taking PrEP.  Use sunscreen. Apply sunscreen liberally and repeatedly throughout the day. You should seek shade when your shadow is shorter than you. Protect yourself by wearing long sleeves, pants, a wide-brimmed hat, and sunglasses year round whenever you are outdoors.  Tell your health care Eeshan Verbrugge of new moles or changes in moles, especially if there is a change in shape or color. Also, tell your health care Avelino Herren if a mole is larger than the size of a pencil eraser.  A one-time screening for abdominal aortic aneurysm (AAA) and surgical repair of large AAAs by ultrasound is recommended for men aged  65-75 years who are current or former smokers.  Stay current with your vaccines (immunizations). Document Released: 01/10/2008 Document Revised: 07/19/2013 Document Reviewed: 12/09/2010 ExitCare Patient Information 2015 [employer]. This information is not intended to replace advice given to you by your health care Brailyn Killion. Make sure you discuss any questions you have with your health care Talaya Lamprecht.  

## 2015-04-23 NOTE — Progress Notes (Signed)
Pre visit review using our clinic review tool, if applicable. No additional management support is needed unless otherwise documented below in the visit note. 

## 2015-04-23 NOTE — Progress Notes (Signed)
Subjective:    Patient ID: Edward Nash, male    DOB: [birth year], 39 y.o.   MRN: [id number]  HPI 64 -year-old patient who is seen today for an annual examination. He has enjoyed excellent health and takes no chronic medications.  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 In 2009 he was living in [place]  for 3 years and had a cardiac evaluation included a cardiac catheterization. This was normal. In April of 2013 the patient had an EKG that revealed inverted T waves in the lateral precordial leads. This was evaluated with a stress echo with normal findings. The patient remains active including full court basketball without limitations.  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 He was born in [place] but moved to [place] at [age] year of age. His parents are retired in [place].  Family history both parents are age 69 and in good health mother has hypercholesterolemia. 2 brothers age 49 and 52 in good health except for obesity  Colonoscopy in [place], [place] 2007 due to bright red rectal bleeding    Review of Systems  Constitutional: Negative for fever, chills, activity change, appetite change and fatigue.  HENT: Negative for congestion, dental problem, ear pain, hearing loss, mouth sores, rhinorrhea, sinus pressure, sneezing, tinnitus, trouble swallowing and voice change.   Eyes: Negative for photophobia, pain, redness and visual disturbance.  Respiratory: Negative for apnea, cough, choking, chest tightness, shortness of breath and wheezing.   Cardiovascular: Negative for chest pain, palpitations and leg swelling.  Gastrointestinal: Negative for nausea, vomiting, abdominal pain, diarrhea, constipation, blood in stool, abdominal distention, anal bleeding and rectal pain.  Genitourinary: Negative for dysuria, urgency, frequency, hematuria, flank pain, decreased urine volume, discharge, penile swelling, scrotal swelling,  difficulty urinating, genital sores and testicular pain.  Musculoskeletal: Negative for myalgias, back pain, joint swelling, arthralgias, gait problem, neck pain and neck stiffness.  Skin: Negative for color change, rash and wound.  Neurological: Negative for dizziness, tremors, seizures, syncope, facial asymmetry, speech difficulty, weakness, light-headedness, numbness and headaches.  Hematological: Negative for adenopathy. Does not bruise/bleed easily.  Psychiatric/Behavioral: Negative for suicidal ideas, hallucinations, behavioral problems, confusion, sleep disturbance, self-injury, dysphoric mood, decreased concentration and agitation. The patient is not nervous/anxious.        Objective:   Physical Exam  Constitutional: He appears well-developed and well-nourished.  HENT:  Head: Normocephalic and atraumatic.  Right Ear: External ear normal.  Left Ear: External ear normal.  Nose: Nose normal.  Mouth/Throat: Oropharynx is clear and moist.  Eyes: Conjunctivae and EOM are normal. Pupils are equal, round, and reactive to light. No scleral icterus.  Neck: Normal range of motion. Neck supple. No JVD present. No thyromegaly present.  Cardiovascular: Regular rhythm, normal heart sounds and intact distal pulses.  Exam reveals no gallop and no friction rub.   No murmur heard. Pulmonary/Chest: Effort normal and breath sounds normal. He exhibits no tenderness.  Abdominal: Soft. Bowel sounds are normal. He exhibits no distension and no mass. There is no tenderness.  Genitourinary: Prostate normal and penis normal.  Musculoskeletal: Normal range of motion. He exhibits no edema or tenderness.  Lymphadenopathy:    He has no cervical adenopathy.  Neurological: He is alert. He has normal reflexes. No cranial nerve deficit. Coordination normal.  Skin: Skin is warm and dry. No rash noted.  Psychiatric: He has a normal mood and affect. His behavior is normal.  Assessment & Plan:    Preventive health examination.  Anxiety disorder.  Continue sertraline  Return here in one or 2 years or as needed

## 2015-05-13 ENCOUNTER — Other Ambulatory Visit: Payer: Self-pay | Admitting: Internal Medicine

## 2015-07-20 ENCOUNTER — Telehealth: Payer: Self-pay | Admitting: Internal Medicine

## 2015-07-20 MED ORDER — TADALAFIL 20 MG PO TABS: 20.0000 mg | ORAL_TABLET | Freq: Every day | ORAL | Status: DC | PRN

## 2015-07-20 NOTE — Telephone Encounter (Signed)
Pt was given a sample of Cialis in past and would like new [facility] [address]. Pt does not remember the mg

## 2015-07-20 NOTE — Telephone Encounter (Signed)
Ok  20 mg  #6  RF 6

## 2015-07-20 NOTE — Telephone Encounter (Signed)
Please advise dosage? Pt said was given sample.

## 2015-07-20 NOTE — Telephone Encounter (Signed)
Pt notified Rx sent to pharmacy

## 2015-10-02 ENCOUNTER — Other Ambulatory Visit: Payer: Self-pay | Admitting: Internal Medicine

## 2015-12-02 ENCOUNTER — Other Ambulatory Visit: Payer: Self-pay | Admitting: Internal Medicine

## 2015-12-21 ENCOUNTER — Encounter: Payer: Self-pay | Admitting: Family Medicine

## 2015-12-21 ENCOUNTER — Ambulatory Visit (INDEPENDENT_AMBULATORY_CARE_PROVIDER_SITE_OTHER): Payer: BLUE CROSS/BLUE SHIELD | Admitting: Family Medicine

## 2015-12-21 VITALS — BP 110/62 | HR 87 | Temp 98.3°F | Ht 73.5 in | Wt 245.1 lb

## 2015-12-21 DIAGNOSIS — J020 Streptococcal pharyngitis: Secondary | ICD-10-CM | POA: Diagnosis not present

## 2015-12-21 MED ORDER — AMOXICILLIN 500 MG PO CAPS: 500.0000 mg | ORAL_CAPSULE | Freq: Two times a day (BID) | ORAL | Status: AC

## 2015-12-21 NOTE — Progress Notes (Signed)
  HPI:  Edward Nash is a pleasant 40 year old here for an acute visit for sore throat. He has added sore throat, fatigue and mildly upset stomach for 2 days. He denies sinus congestion, cough, sneezing, shortness of breath, nausea vomiting or diarrhea. His wife and all of his children are currently on antibiotics for strep throat. His wife was diagnosed yesterday strep.  ROS: See pertinent positives and negatives per HPI.  Past Medical History  Diagnosis Date  . Migraines     No past surgical history on file.  No family history on file.  Social History   Social History  . Marital Status: Married    Spouse Name: N/A  . Number of Children: N/A  . Years of Education: N/A   Social History Main Topics  . Smoking status: Never Smoker   . Smokeless tobacco: Former NeurosurgeonUser    Types: Chew  . Alcohol Use: 0.6 oz/week    1 Cans of beer per week  . Drug Use: No  . Sexual Activity: Not Asked   Other Topics Concern  . None   Social History Narrative     Current outpatient prescriptions:  .  sertraline (ZOLOFT) 100 MG tablet, TAKE 1 TABLET EVERY DAY, Disp: 90 tablet, Rfl: 0 .  tadalafil (CIALIS) 20 MG tablet, Take 1 tablet (20 mg total) by mouth daily as needed for erectile dysfunction., Disp: 6 tablet, Rfl: 6 .  amoxicillin (AMOXIL) 500 MG capsule, Take 1 capsule (500 mg total) by mouth 2 (two) times daily., Disp: 20 capsule, Rfl: 0  EXAM:  Filed Vitals:   12/21/15 1501  BP: 110/62  Pulse: 87  Temp: 98.3 F (36.8 C)    Body mass index is 31.9 kg/(m^2).  GENERAL: vitals reviewed and listed above, alert, oriented, appears well hydrated and in no acute distress  HEENT: atraumatic, conjunttiva clear, no obvious abnormalities on inspection of external nose and ears, normal appearance of the ear canals and TMs, normal appearance of the nasal mucosa, on inspection of the oropharynx he does have some erythema and edema of the tonsils bilaterally.  NECK: no obvious masses on  inspection  LUNGS: clear to auscultation bilaterally, no wheezes, rales or rhonchi, good air movement  CV: HRRR, no peripheral edema  MS: moves all extremities without noticeable abnormality  PSYCH: pleasant and cooperative, no obvious depression or anxiety  ASSESSMENT AND PLAN:  Discussed the following assessment and plan:  Streptococcal sore throat  -Discussed potential etiologies with strep pharyngitis most likely -Advised testing to confirm, he declined and would prefer to do empiric antibiotics instead -Discussed treatment options/risks, potential complications, return precautions. He decided to do amoxicillin. -Patient advised to return or notify a doctor immediately if symptoms worsen or persist or new concerns arise.  There are no Patient Instructions on file for this visit.   Kriste BasqueKIM, HANNAH R.

## 2015-12-21 NOTE — Progress Notes (Signed)
Pre visit review using our clinic review tool, if applicable. No additional management support is needed unless otherwise documented below in the visit note. 

## 2016-01-01 ENCOUNTER — Other Ambulatory Visit: Payer: Self-pay | Admitting: Internal Medicine

## 2016-06-25 ENCOUNTER — Other Ambulatory Visit: Payer: Self-pay | Admitting: Internal Medicine

## 2016-09-28 ENCOUNTER — Other Ambulatory Visit: Payer: Self-pay | Admitting: Internal Medicine

## 2016-10-19 DIAGNOSIS — T24202A Burn of second degree of unspecified site of left lower limb, except ankle and foot, initial encounter: Secondary | ICD-10-CM | POA: Diagnosis not present

## 2016-10-27 ENCOUNTER — Other Ambulatory Visit: Payer: Self-pay | Admitting: Internal Medicine

## 2016-10-27 DIAGNOSIS — T24202D Burn of second degree of unspecified site of left lower limb, except ankle and foot, subsequent encounter: Secondary | ICD-10-CM | POA: Diagnosis not present

## 2016-10-29 ENCOUNTER — Encounter: Payer: Self-pay | Admitting: Family Medicine

## 2016-10-29 ENCOUNTER — Ambulatory Visit (INDEPENDENT_AMBULATORY_CARE_PROVIDER_SITE_OTHER): Payer: BLUE CROSS/BLUE SHIELD | Admitting: Family Medicine

## 2016-10-29 VITALS — BP 100/70 | HR 70 | Temp 98.5°F | Wt 248.1 lb

## 2016-10-29 DIAGNOSIS — R21 Rash and other nonspecific skin eruption: Secondary | ICD-10-CM

## 2016-10-29 DIAGNOSIS — T24211A Burn of second degree of right thigh, initial encounter: Secondary | ICD-10-CM | POA: Diagnosis not present

## 2016-10-29 DIAGNOSIS — L270 Generalized skin eruption due to drugs and medicaments taken internally: Secondary | ICD-10-CM

## 2016-10-29 MED ORDER — METHYLPREDNISOLONE ACETATE 80 MG/ML IJ SUSP
80.0000 mg | Freq: Once | INTRAMUSCULAR | Status: AC
  Administered 2016-10-29: 80 mg via INTRAMUSCULAR

## 2016-10-29 NOTE — Progress Notes (Signed)
Subjective:     Patient ID: Edward Nash, male   DOB: [birth year], 41 y.o.   MRN: [id number]  HPI Patient seen with rash on his trunk region which started last night and progressive rash left thigh. Recent history is about 10 days ago he was at [facility] and spilled some hot coffee on his leg. He had large second-degree burn left thigh. Has been seen twice at urgent care. He has history of sulfa allergy. Apparently was placed on Clindamycin orally as a prophylactic measure and finished last dose last night. He had occasional loose stool but no severe diarrhea. Last night broke out in fine blanching macular rash on his chest which is slightly pruritic. No hives.  Has been using bacitracin ointment on his burn wound left anterior thigh and has developed fairly severely pruritic rash mostly below the wound region. He's been wrapping the wound in gauze daily.  He does not have any tenderness in the left thigh. No fevers or chills. No sore throat. His wife was concerned that he may have "scarlet fever" rash. He again has no sore throat and no fever whatsoever  Past Medical History:  Diagnosis Date  . Migraines    No past surgical history on file.  reports that he has never smoked. He has quit using smokeless tobacco. His smokeless tobacco use included Chew. He reports that he drinks about 0.6 oz of alcohol per week . He reports that he does not use drugs. family history is not on file. Allergies  Allergen Reactions  . Sulfa Antibiotics Rash     Review of Systems  Constitutional: Negative for chills and fever.  HENT: Negative for sore throat.   Respiratory: Negative for cough and shortness of breath.   Skin: Positive for rash.  Hematological: Negative for adenopathy.       Objective:   Physical Exam  Constitutional: He appears well-developed and well-nourished.  HENT:  Mouth/Throat: Oropharynx is clear and moist.  Neck: Neck supple.  Cardiovascular: Normal rate and regular rhythm.    Pulmonary/Chest: Effort normal and breath sounds normal. No respiratory distress. He has no wheezes. He has no rales.  Lymphadenopathy:    He has no cervical adenopathy.  Skin: Rash noted.  Patient has 13 x 4 cm second-degree burn wound left anterior thigh. Down below this region he has what appears to be allergic rash which is nontender and not warm to palpation. He has some erythema involving much of the left anterior thigh below this region which again is nontender and looks more hive-like in nature  Patient has much finer blanching maculopapular rash on his trunk region       Assessment:     #1 recent second-degree burn left anterior thigh. This is slowly healing with no signs of secondary infection.  #2 trunk rash. Etiology unclear. Certainly would consider possibility of drug allergy  #3 probable contact allergy left anterior thigh. Possibly related to bacitracin topical. He does not have tenderness or exam to suggest cellulitis changes    Plan:     -Leave off bacitracin or any other over-the-counter topical antibiotic ointments -Continue to clean left anterior thigh daily with soap and water -Discontinue clindamycin -Depo-Medrol 80 mg IM given for his very likely contact allergy rash left thigh which has progressed through the day -Recommend follow-up with primary within 1 week to reassess and sooner as needed  Kristian Covey MD [facility]

## 2016-10-29 NOTE — Progress Notes (Signed)
Pre visit review using our clinic review tool, if applicable. No additional management support is needed unless otherwise documented below in the visit note. 

## 2016-10-29 NOTE — Patient Instructions (Signed)
Leave off Bacitracin topical cream Clean wound daily with soap and water Follow up promptly for any fever, increased pain or other concern May consider OTC antihistamine for skin rash.

## 2016-11-09 ENCOUNTER — Other Ambulatory Visit: Payer: Self-pay | Admitting: Internal Medicine

## 2016-11-10 ENCOUNTER — Other Ambulatory Visit: Payer: Self-pay | Admitting: Internal Medicine

## 2016-11-10 MED ORDER — SERTRALINE HCL 100 MG PO TABS: ORAL_TABLET | ORAL | 1 refills | Status: DC

## 2016-11-10 MED ORDER — TADALAFIL 20 MG PO TABS: 20.0000 mg | ORAL_TABLET | Freq: Every day | ORAL | 1 refills | Status: DC | PRN

## 2016-11-10 NOTE — Telephone Encounter (Signed)
Rx's sent with enough medication until physical.

## 2016-11-10 NOTE — Telephone Encounter (Signed)
°  Pt is scheduled for his physical in June and said it was ok to refill till then     Pt request refill of the following:  sertraline (ZOLOFT) 100 MG tablet  tadalafil (CIALIS) 20 MG tablet     Phamacy:  [facility]

## 2016-11-18 ENCOUNTER — Encounter: Payer: BLUE CROSS/BLUE SHIELD | Admitting: Internal Medicine

## 2017-01-03 ENCOUNTER — Other Ambulatory Visit: Payer: Self-pay | Admitting: Internal Medicine

## 2017-01-09 ENCOUNTER — Encounter: Payer: Self-pay | Admitting: Internal Medicine

## 2017-01-09 ENCOUNTER — Ambulatory Visit (INDEPENDENT_AMBULATORY_CARE_PROVIDER_SITE_OTHER): Payer: BLUE CROSS/BLUE SHIELD | Admitting: Internal Medicine

## 2017-01-09 VITALS — BP 102/74 | HR 72 | Temp 98.4°F | Ht 75.0 in | Wt 249.2 lb

## 2017-01-09 DIAGNOSIS — E669 Obesity, unspecified: Secondary | ICD-10-CM | POA: Diagnosis not present

## 2017-01-09 DIAGNOSIS — Z0000 Encounter for general adult medical examination without abnormal findings: Secondary | ICD-10-CM | POA: Diagnosis not present

## 2017-01-09 NOTE — Progress Notes (Signed)
Subjective:    Patient ID: Edward Nash, male    DOB: 07/22/1976, 41 y.o.   MRN: [id number]  HPI  Wt Readings from Last 3 Encounters:  01/09/17 249 lb 3.2 oz (113 kg)  10/29/16 248 lb 1.6 oz (112.5 kg)  12/21/15 245 lb 1.6 oz (65111.602 kg)   41 year old patient who is seen today for a preventive health examination. He enjoys excellent health and has no major concerns or complaints.  He does use sertraline with benefit for a anxiety disorder. Remains quite active.  3 children.  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 In 2009 he was living in [place]  for 3 years and had a cardiac evaluation included a cardiac catheterization. This was normal. In April of 2013 the patient had an EKG that revealed inverted T waves in the lateral precordial leads. This was evaluated with a stress echo with normal findings. The patient remains active including full court basketball without limitations.  Social history [place]. He was born in [place] but moved to [place] at [age] year of age. His parents are retired in [place].  Family history both parents are age 43 and in good health mother has hypercholesterolemia. 2 brothers age 43  and 5447 in good health except for obesity  [place] 2007 due to bright red rectal bleeding Review of Systems  Constitutional: Negative for appetite change, chills, fatigue and fever.  HENT: Negative for congestion, dental problem, ear pain, hearing loss, sore throat, tinnitus, trouble swallowing and voice change.   Eyes: Negative for pain, discharge and visual disturbance.  Respiratory: Negative for cough, chest tightness, wheezing and stridor.   Cardiovascular: Negative for chest pain, palpitations and leg swelling.  Gastrointestinal: Negative for abdominal distention, abdominal pain, blood in stool,  constipation, diarrhea, nausea and vomiting.  Genitourinary: Negative for difficulty urinating, discharge, flank pain, genital sores, hematuria and urgency.  Musculoskeletal: Negative for arthralgias, back pain, gait problem, joint swelling, myalgias and neck stiffness.  Skin: Negative for rash.  Neurological: Negative for dizziness, syncope, speech difficulty, weakness, numbness and headaches.  Hematological: Negative for adenopathy. Does not bruise/bleed easily.  Psychiatric/Behavioral: Negative for behavioral problems and dysphoric mood. The patient is not nervous/anxious.        Objective:   Physical Exam  Constitutional: He appears well-developed and well-nourished.  HENT:  Head: Normocephalic and atraumatic.  Right Ear: External ear normal.  Left Ear: External ear normal.  Nose: Nose normal.  Mouth/Throat: Oropharynx is clear and moist.  Eyes: Conjunctivae and EOM are normal. Pupils are equal, round, and reactive to light. No scleral icterus.  Neck: Normal range of motion. Neck supple. No JVD present. No thyromegaly present.  Cardiovascular: Regular rhythm, normal heart sounds and intact distal pulses.  Exam reveals no gallop and no friction rub.   No murmur heard. Pulmonary/Chest: Effort normal and breath sounds normal. He exhibits no tenderness.  Abdominal: Soft. Bowel sounds are normal. He exhibits no distension and no mass. There is no tenderness.  Genitourinary: Penis normal.  Musculoskeletal: Normal range of motion. He exhibits no edema or tenderness.  Lymphadenopathy:    He has no cervical adenopathy.  Neurological: He is alert. He has normal reflexes. No cranial nerve deficit. Coordination normal.  Skin: Skin is warm and dry. No rash noted.  Psychiatric: He has a normal mood and affect. His  behavior is normal.          Assessment & Plan:   Preventive health examination Overweight.  Modest weight loss encouraged  Patient scheduled for lab draw early next week  after an overnight fast.  Will review studies  Return in one-2 years for follow-up  Rogelia Boga

## 2017-01-09 NOTE — Patient Instructions (Addendum)
Limit your sodium (Salt) intake    It is important that you exercise regularly, at least 20 minutes 3 to 4 times per week.  If you develop chest pain or shortness of breath seek  medical attention.  Return next week as scheduled for laboratory studies after an overnight fast  You need to lose weight.  Consider a lower calorie diet and regular exercise.

## 2017-01-13 ENCOUNTER — Other Ambulatory Visit: Payer: BLUE CROSS/BLUE SHIELD | Admitting: Internal Medicine

## 2017-01-22 ENCOUNTER — Other Ambulatory Visit: Payer: BLUE CROSS/BLUE SHIELD

## 2017-01-22 ENCOUNTER — Telehealth: Payer: Self-pay | Admitting: Internal Medicine

## 2017-01-22 DIAGNOSIS — Z0000 Encounter for general adult medical examination without abnormal findings: Secondary | ICD-10-CM

## 2017-01-22 NOTE — Telephone Encounter (Signed)
Lab orders placed in epic.

## 2017-01-22 NOTE — Telephone Encounter (Signed)
Patient needs lab orders put in from his physical on 01/09/17.  Patient will be here for labs on Friday, 01/23/17 at 10:00 am.

## 2017-01-23 ENCOUNTER — Other Ambulatory Visit (INDEPENDENT_AMBULATORY_CARE_PROVIDER_SITE_OTHER): Payer: BLUE CROSS/BLUE SHIELD

## 2017-01-23 DIAGNOSIS — Z0000 Encounter for general adult medical examination without abnormal findings: Secondary | ICD-10-CM

## 2017-01-23 LAB — CBC WITH DIFFERENTIAL/PLATELET
Basophils Absolute: 0 10*3/uL (ref 0.0–0.1)
Basophils Relative: 0.6 % (ref 0.0–3.0)
Eosinophils Absolute: 0.1 10*3/uL (ref 0.0–0.7)
Eosinophils Relative: 1.3 % (ref 0.0–5.0)
HCT: 41.2 % (ref 39.0–52.0)
Hemoglobin: 14.3 g/dL (ref 13.0–17.0)
Lymphocytes Relative: 29 % (ref 12.0–46.0)
Lymphs Abs: 1.9 10*3/uL (ref 0.7–4.0)
MCHC: 34.6 g/dL (ref 30.0–36.0)
MCV: 87.5 fl (ref 78.0–100.0)
Monocytes Absolute: 0.5 10*3/uL (ref 0.1–1.0)
Monocytes Relative: 7.9 % (ref 3.0–12.0)
Neutro Abs: 4 10*3/uL (ref 1.4–7.7)
Neutrophils Relative %: 61.2 % (ref 43.0–77.0)
Platelets: 212 10*3/uL (ref 150.0–400.0)
RBC: 4.71 Mil/uL (ref 4.22–5.81)
RDW: 13 % (ref 11.5–15.5)
WBC: 6.6 10*3/uL (ref 4.0–10.5)

## 2017-01-23 LAB — COMPREHENSIVE METABOLIC PANEL
ALT: 30 U/L (ref 0–53)
AST: 19 U/L (ref 0–37)
Albumin: 4.5 g/dL (ref 3.5–5.2)
Alkaline Phosphatase: 43 U/L (ref 39–117)
BUN: 14 mg/dL (ref 6–23)
CO2: 31 mEq/L (ref 19–32)
Calcium: 9.7 mg/dL (ref 8.4–10.5)
Chloride: 104 mEq/L (ref 96–112)
Creatinine, Ser: 0.91 mg/dL (ref 0.40–1.50)
GFR: 96.73 mL/min (ref >60.00)
Glucose, Bld: 98 mg/dL (ref 70–99)
Potassium: 4.2 mEq/L (ref 3.5–5.1)
Sodium: 141 mEq/L (ref 135–145)
Total Bilirubin: 0.6 mg/dL (ref 0.2–1.2)
Total Protein: 6.8 g/dL (ref 6.0–8.3)

## 2017-01-23 LAB — LIPID PANEL
Cholesterol: 248 mg/dL — ABNORMAL HIGH (ref 0–200)
HDL: 53.8 mg/dL (ref >39.00)
LDL Cholesterol: 175 mg/dL — ABNORMAL HIGH (ref 0–99)
NonHDL: 194.42
Total CHOL/HDL Ratio: 5
Triglycerides: 98 mg/dL (ref 0.0–149.0)
VLDL: 19.6 mg/dL (ref 0.0–40.0)

## 2017-01-23 LAB — TSH: TSH: 2.96 u[IU]/mL (ref 0.35–4.50)

## 2017-02-28 ENCOUNTER — Other Ambulatory Visit: Payer: Self-pay | Admitting: Internal Medicine

## 2017-03-13 ENCOUNTER — Ambulatory Visit (INDEPENDENT_AMBULATORY_CARE_PROVIDER_SITE_OTHER): Payer: BLUE CROSS/BLUE SHIELD | Admitting: Internal Medicine

## 2017-03-13 ENCOUNTER — Encounter: Payer: Self-pay | Admitting: Internal Medicine

## 2017-03-13 VITALS — BP 128/62 | HR 78 | Temp 98.6°F | Ht 75.0 in | Wt 250.8 lb

## 2017-03-13 DIAGNOSIS — J020 Streptococcal pharyngitis: Secondary | ICD-10-CM

## 2017-03-13 MED ORDER — PENICILLIN V POTASSIUM 500 MG PO TABS: 500.0000 mg | ORAL_TABLET | Freq: Three times a day (TID) | ORAL | 0 refills | Status: DC

## 2017-03-13 NOTE — Progress Notes (Signed)
Subjective:    Patient ID: Edward Nash, male    DOB: [birth year], 41 y.o.   MRN: [id number]  HPI  41 year old patient who presents with a one-week history of persistent sore throat and cervical adenopathy.  He has had some mild headache but otherwise no other complaints.  No documented fever or URI symptoms.  He has just returned from a trip to [place] with his family.  He states that he has had streptococcal pharyngitis in the past  Past Medical History:  Diagnosis Date  . Migraines      Social History   Social History  . Marital status: Married    Spouse name: N/A  . Number of children: N/A  . Years of education: N/A   Occupational History  . Not on file.   Social History Main Topics  . Smoking status: Never Smoker  . Smokeless tobacco: Former Neurosurgeon    Types: Chew  . Alcohol use 0.6 oz/week    1 Cans of beer per week  . Drug use: No  . Sexual activity: Not on file   Other Topics Concern  . Not on file   Social History Narrative  . No narrative on file    No past surgical history on file.  No family history on file.  Allergies  Allergen Reactions  . Sulfa Antibiotics Rash    Current Outpatient Prescriptions on File Prior to Visit  Medication Sig Dispense Refill  . sertraline (ZOLOFT) 100 MG tablet TAKE 1 TABLET BY MOUTH EVERY DAY 30 tablet 1  . tadalafil (CIALIS) 20 MG tablet Take 1 tablet (20 mg total) by mouth daily as needed for erectile dysfunction. 6 tablet 1   No current facility-administered medications on file prior to visit.     BP 128/62 (BP Location: Left Arm, Patient Position: Sitting, Cuff Size: Normal)   Pulse 78   Temp 98.6 F (37 C) (Oral)   Ht 6\' 3"  (1.905 m)   Wt 250 lb 12.8 oz (113.8 kg)   SpO2 95%   BMI 31.35 kg/m     Review of Systems  Constitutional: Negative for appetite change, chills, fatigue and fever.  HENT: Positive for sore throat. Negative for congestion, dental problem, ear pain, hearing loss, tinnitus,  trouble swallowing and voice change.   Eyes: Negative for pain, discharge and visual disturbance.  Respiratory: Negative for cough, chest tightness, wheezing and stridor.   Cardiovascular: Negative for chest pain, palpitations and leg swelling.  Gastrointestinal: Negative for abdominal distention, abdominal pain, blood in stool, constipation, diarrhea, nausea and vomiting.  Genitourinary: Negative for difficulty urinating, discharge, flank pain, genital sores, hematuria and urgency.  Musculoskeletal: Positive for neck pain. Negative for arthralgias, back pain, gait problem, joint swelling, myalgias and neck stiffness.  Skin: Negative for rash.  Neurological: Positive for headaches. Negative for dizziness, syncope, speech difficulty, weakness and numbness.  Hematological: Negative for adenopathy. Does not bruise/bleed easily.  Psychiatric/Behavioral: Negative for behavioral problems and dysphoric mood. The patient is not nervous/anxious.        Objective:   Physical Exam  Constitutional: He is oriented to person, place, and time. He appears well-developed.  HENT:  Head: Normocephalic.  Right Ear: External ear normal.  Left Ear: External ear normal.  Oropharynx is erythematous without exudate  Eyes: Conjunctivae and EOM are normal.  Neck: Normal range of motion.  Tender anterior cervical adenopathy  Cardiovascular: Normal rate and normal heart sounds.   Pulmonary/Chest: Breath sounds normal.  Abdominal: Bowel sounds  are normal.  Musculoskeletal: Normal range of motion. He exhibits no edema or tenderness.  Lymphadenopathy:    He has cervical adenopathy.  Neurological: He is alert and oriented to person, place, and time.  Psychiatric: He has a normal mood and affect. His behavior is normal.          Assessment & Plan:   GAS pharyngitis.  Will treat with Pen-Vee K for 10 days Symptomatic treatment discussed  Rogelia Boga

## 2017-03-13 NOTE — Patient Instructions (Addendum)
Sore Throat When you have a sore throat, your throat may:  Hurt.  Burn.  Feel irritated.  Feel scratchy.  Many things can cause a sore throat, including:  An infection.  Allergies.  Dryness in the air.  Smoke or pollution.  Gastroesophageal reflux disease (GERD).  A tumor.  A sore throat can be the first sign of another sickness. It can happen with other problems, like coughing or a fever. Most sore throats go away without treatment. Follow these instructions at home:  Take over-the-counter medicines only as told by your doctor.  Drink enough fluids to keep your pee (urine) clear or pale yellow.  Rest when you feel you need to.  To help with pain, try: ? Sipping warm liquids, such as broth, herbal tea, or warm water. ? Eating or drinking cold or frozen liquids, such as frozen ice pops. ? Gargling with a salt-water mixture 3-4 times a day or as needed. To make a salt-water mixture, add -1 tsp of salt in 1 cup of warm water. Mix it until you cannot see the salt anymore. ? Sucking on hard candy or throat lozenges. ? Putting a cool-mist humidifier in your bedroom at night. ? Sitting in the bathroom with the door closed for 5-10 minutes while you run hot water in the shower.  Do not use any tobacco products, such as cigarettes, chewing tobacco, and e-cigarettes. If you need help quitting, ask your doctor. Contact a doctor if:  You have a fever for more than 2-3 days.  You keep having symptoms for more than 2-3 days.  Your throat does not get better in 7 days.  You have a fever and your symptoms suddenly get worse. Get help right away if:  You have trouble breathing.  You cannot swallow fluids, soft foods, or your saliva.  You have swelling in your throat or neck that gets worse.  You keep feeling like you are going to throw up (vomit).  You keep throwing up. This information is not intended to replace advice given to you by your health care provider. Make  sure you discuss any questions you have with your health care provider. Document Released: 04/22/2008 Document Revised: 03/09/2016 Document Reviewed: 05/04/2015 Elsevier Interactive Patient Education  2018 [employer].   Take your antibiotic as prescribed until ALL of it is gone, but stop if you develop a rash, swelling, or any side effects of the medication.  Contact our office as soon as possible if  there are side effects of the medication.

## 2017-04-16 ENCOUNTER — Encounter: Payer: Self-pay | Admitting: Internal Medicine

## 2017-04-29 ENCOUNTER — Other Ambulatory Visit: Payer: Self-pay | Admitting: Internal Medicine

## 2017-07-11 ENCOUNTER — Other Ambulatory Visit: Payer: Self-pay | Admitting: Internal Medicine

## 2017-09-17 ENCOUNTER — Other Ambulatory Visit: Payer: Self-pay | Admitting: Internal Medicine

## 2017-09-21 NOTE — Telephone Encounter (Signed)
Medication sent in electronically.  

## 2017-09-21 NOTE — Telephone Encounter (Signed)
Dr K pt 

## 2018-03-31 ENCOUNTER — Encounter: Payer: BLUE CROSS/BLUE SHIELD | Admitting: Internal Medicine

## 2018-04-13 ENCOUNTER — Encounter: Payer: BLUE CROSS/BLUE SHIELD | Admitting: Internal Medicine

## 2018-04-22 ENCOUNTER — Encounter: Payer: Self-pay | Admitting: Adult Health

## 2018-04-22 ENCOUNTER — Ambulatory Visit (INDEPENDENT_AMBULATORY_CARE_PROVIDER_SITE_OTHER): Payer: BLUE CROSS/BLUE SHIELD | Admitting: Adult Health

## 2018-04-22 VITALS — BP 118/60 | HR 69 | Temp 98.5°F | Ht 73.5 in | Wt 254.4 lb

## 2018-04-22 DIAGNOSIS — E782 Mixed hyperlipidemia: Secondary | ICD-10-CM

## 2018-04-22 DIAGNOSIS — Z0000 Encounter for general adult medical examination without abnormal findings: Secondary | ICD-10-CM

## 2018-04-22 DIAGNOSIS — F419 Anxiety disorder, unspecified: Secondary | ICD-10-CM | POA: Diagnosis not present

## 2018-04-22 DIAGNOSIS — Z125 Encounter for screening for malignant neoplasm of prostate: Secondary | ICD-10-CM | POA: Diagnosis not present

## 2018-04-22 DIAGNOSIS — R9431 Abnormal electrocardiogram [ECG] [EKG]: Secondary | ICD-10-CM | POA: Diagnosis not present

## 2018-04-22 LAB — HEPATIC FUNCTION PANEL
ALT: 19 U/L (ref 0–53)
AST: 14 U/L (ref 0–37)
Albumin: 4.6 g/dL (ref 3.5–5.2)
Alkaline Phosphatase: 49 U/L (ref 39–117)
Bilirubin, Direct: 0.1 mg/dL (ref 0.0–0.3)
Total Bilirubin: 0.7 mg/dL (ref 0.2–1.2)
Total Protein: 7 g/dL (ref 6.0–8.3)

## 2018-04-22 LAB — CBC WITH DIFFERENTIAL/PLATELET
Basophils Absolute: 0 10*3/uL (ref 0.0–0.1)
Basophils Relative: 0.5 % (ref 0.0–3.0)
Eosinophils Absolute: 0.1 10*3/uL (ref 0.0–0.7)
Eosinophils Relative: 0.9 % (ref 0.0–5.0)
HCT: 42.9 % (ref 39.0–52.0)
Hemoglobin: 14.5 g/dL (ref 13.0–17.0)
Lymphocytes Relative: 28.5 % (ref 12.0–46.0)
Lymphs Abs: 2 10*3/uL (ref 0.7–4.0)
MCHC: 33.8 g/dL (ref 30.0–36.0)
MCV: 86.9 fl (ref 78.0–100.0)
Monocytes Absolute: 0.6 10*3/uL (ref 0.1–1.0)
Monocytes Relative: 8.5 % (ref 3.0–12.0)
Neutro Abs: 4.3 10*3/uL (ref 1.4–7.7)
Neutrophils Relative %: 61.6 % (ref 43.0–77.0)
Platelets: 238 10*3/uL (ref 150.0–400.0)
RBC: 4.93 Mil/uL (ref 4.22–5.81)
RDW: 12.8 % (ref 11.5–15.5)
WBC: 7 10*3/uL (ref 4.0–10.5)

## 2018-04-22 LAB — LIPID PANEL
Cholesterol: 221 mg/dL — ABNORMAL HIGH (ref 0–200)
HDL: 54 mg/dL (ref >39.00)
LDL Cholesterol: 145 mg/dL — ABNORMAL HIGH (ref 0–99)
NonHDL: 167.23
Total CHOL/HDL Ratio: 4
Triglycerides: 112 mg/dL (ref 0.0–149.0)
VLDL: 22.4 mg/dL (ref 0.0–40.0)

## 2018-04-22 LAB — BASIC METABOLIC PANEL
BUN: 12 mg/dL (ref 6–23)
CO2: 30 mEq/L (ref 19–32)
Calcium: 9.9 mg/dL (ref 8.4–10.5)
Chloride: 102 mEq/L (ref 96–112)
Creatinine, Ser: 0.92 mg/dL (ref 0.40–1.50)
GFR: 94.97 mL/min (ref >60.00)
Glucose, Bld: 85 mg/dL (ref 70–99)
Potassium: 4 mEq/L (ref 3.5–5.1)
Sodium: 138 mEq/L (ref 135–145)

## 2018-04-22 LAB — PSA: PSA: 0.48 ng/mL (ref 0.10–4.00)

## 2018-04-22 LAB — TSH: TSH: 2.14 u[IU]/mL (ref 0.35–4.50)

## 2018-04-22 NOTE — Patient Instructions (Signed)
It was nice meeting you today   Your EKG came back normal and I will follow up with you regarding your blood work   Please work on weight loss through diet and exercise

## 2018-04-22 NOTE — Progress Notes (Signed)
Subjective:    Patient ID: Edward Nash, male    DOB: [birth year], 42 y.o.   MRN: [id number]  HPI Patient presents for yearly preventative medicine examination. He is a pleasant 42 year old male who  has a past medical history of Anxiety, Hyperlipidemia, and Migraines. He is a previous patient of Dr. Kirtland Bouchard  Anxiety - Takes Zoloft - finds benefit in this medication   ED - takes Cialis daily   Abnormal EKG - reports remote history of this. He had t-wave abnormality. Had a cardiac cath while he was living in [place] and this came back normal. He denies any CP or SOB. He would like to have another EKG for peace of mind   All immunizations and health maintenance protocols were reviewed with the patient and needed orders were placed. Refuses flu shot   Appropriate screening laboratory values were ordered for the patient including screening of hyperlipidemia, renal function and hepatic function. If indicated by BPH, a PSA was ordered.  Medication reconciliation,  past medical history, social history, problem list and allergies were reviewed in detail with the patient  Goals were established with regard to weight loss, exercise, and  diet in compliance with medications. He does not exercise on a routine basis. Does not eat a heart healthy diet.   Wt Readings from Last 3 Encounters:  04/22/18 254 lb 6.4 oz (115.4 kg)  03/13/17 250 lb 12.8 oz (113.8 kg)  01/09/17 249 lb 3.2 oz (113 kg)   Review of Systems  Constitutional: Negative.   HENT: Negative.   Eyes: Negative.   Respiratory: Negative.   Cardiovascular: Negative.   Gastrointestinal: Negative.   Endocrine: Negative.   Genitourinary: Negative.   Musculoskeletal: Negative.   Skin: Negative.   Allergic/Immunologic: Negative.   Neurological: Negative.   Hematological: Negative.   Psychiatric/Behavioral: Negative.   All other systems reviewed and are negative.  Past Medical History:  Diagnosis Date  . Anxiety   . Hyperlipidemia    . Migraines     Social History   Socioeconomic History  . Marital status: Married    Spouse name: Not on file  . Number of children: Not on file  . Years of education: Not on file  . Highest education level: Not on file  Occupational History  . Not on file  Social Needs  . Financial resource strain: Not on file  . Food insecurity:    Worry: Not on file    Inability: Not on file  . Transportation needs:    Medical: Not on file    Non-medical: Not on file  Tobacco Use  . Smoking status: Never Smoker  . Smokeless tobacco: Former Neurosurgeon    Types: Chew  Substance and Sexual Activity  . Alcohol use: Yes    Alcohol/week: 1.0 standard drinks    Types: 1 Cans of beer per week  . Drug use: No  . Sexual activity: Not on file  Lifestyle  . Physical activity:    Days per week: Not on file    Minutes per session: Not on file  . Stress: Not on file  Relationships  . Social connections:    Talks on phone: Not on file    Gets together: Not on file    Attends religious service: Not on file    Active member of club or organization: Not on file    Attends meetings of clubs or organizations: Not on file    Relationship status: Not on file  .  Intimate partner violence:    Fear of current or ex partner: Not on file    Emotionally abused: Not on file    Physically abused: Not on file    Forced sexual activity: Not on file  Other Topics Concern  . Not on file  Social History Narrative  . Not on file    Past Surgical History:  Procedure Laterality Date  . CARDIAC CATHETERIZATION  2009    No family history on file.  Allergies  Allergen Reactions  . Sulfa Antibiotics Rash    Current Outpatient Medications on File Prior to Visit  Medication Sig Dispense Refill  . CIALIS 20 MG tablet TAKE 1 TABLET BY MOUTH DAILY AS NEEDED FOR ERECTILE DYSFUNCTION 6 tablet 1  . sertraline (ZOLOFT) 100 MG tablet TAKE 1 TABLET BY MOUTH EVERY DAY 90 tablet 2   No current facility-administered  medications on file prior to visit.     BP 118/60 (BP Location: Left Arm, Patient Position: Sitting, Cuff Size: Normal)   Pulse 69   Temp 98.5 F (36.9 C) (Oral)   Ht 6' 1.5" (1.867 m)   Wt 254 lb 6.4 oz (115.4 kg)   SpO2 96%   BMI 33.11 kg/m       Objective:   Physical Exam  Constitutional: He is oriented to person, place, and time. He appears well-developed and well-nourished. No distress.  HENT:  Head: Normocephalic and atraumatic.  Right Ear: External ear normal.  Left Ear: External ear normal.  Nose: Nose normal.  Mouth/Throat: Oropharynx is clear and moist. No oropharyngeal exudate.  Eyes: Pupils are equal, round, and reactive to light. Conjunctivae and EOM are normal. Right eye exhibits no discharge. Left eye exhibits no discharge. No scleral icterus.  Neck: Normal range of motion. Neck supple. No JVD present. No tracheal deviation present. No thyromegaly present.  Cardiovascular: Normal rate, regular rhythm, normal heart sounds and intact distal pulses. Exam reveals no gallop and no friction rub.  No murmur heard. Pulmonary/Chest: Effort normal and breath sounds normal. No stridor. No respiratory distress. He has no wheezes. He has no rales. He exhibits no tenderness.  Abdominal: Soft. Bowel sounds are normal. He exhibits no distension and no mass. There is no tenderness. There is no rebound and no guarding. No hernia.  Musculoskeletal: Normal range of motion. He exhibits no edema, tenderness or deformity.  Lymphadenopathy:    He has no cervical adenopathy.  Neurological: He is alert and oriented to person, place, and time. He displays normal reflexes. No cranial nerve deficit or sensory deficit. He exhibits normal muscle tone. Coordination normal.  Skin: Skin is warm and dry. No rash noted. He is not diaphoretic. No erythema. No pallor.  Psychiatric: He has a normal mood and affect. His behavior is normal. Judgment and thought content normal.  Nursing note and vitals  reviewed.     Assessment & Plan:  1. Routine general medical examination at a health care facility - Encouraged heart healthy diet and exercise  - follow up in one year or sooner if needed - Basic metabolic panel - CBC with Differential/Platelet - Hepatic function panel - Lipid panel - TSH  2. Anxiety - Controlled with Zoloft - no change   3. Mixed hyperlipidemia - consider statin  - Basic metabolic panel - CBC with Differential/Platelet - Hepatic function panel - Lipid panel - TSH  4. Prostate cancer screening  - PSA  5. Abnormal EKG - EKG 12-Lead- NSR, rate 62   Olin Gurski,  NP

## 2018-04-29 ENCOUNTER — Other Ambulatory Visit: Payer: Self-pay | Admitting: Internal Medicine

## 2018-09-16 DIAGNOSIS — M5431 Sciatica, right side: Secondary | ICD-10-CM | POA: Diagnosis not present

## 2018-09-16 DIAGNOSIS — M545 Low back pain: Secondary | ICD-10-CM | POA: Diagnosis not present

## 2018-12-14 ENCOUNTER — Encounter: Payer: Self-pay | Admitting: Family Medicine

## 2018-12-14 ENCOUNTER — Ambulatory Visit (INDEPENDENT_AMBULATORY_CARE_PROVIDER_SITE_OTHER): Payer: BC Managed Care – PPO | Admitting: Family Medicine

## 2018-12-14 DIAGNOSIS — G43109 Migraine with aura, not intractable, without status migrainosus: Secondary | ICD-10-CM | POA: Insufficient documentation

## 2018-12-14 MED ORDER — ONDANSETRON HCL 4 MG PO TABS: 4.0000 mg | ORAL_TABLET | Freq: Three times a day (TID) | ORAL | 0 refills | Status: DC | PRN

## 2018-12-14 MED ORDER — DICLOFENAC POTASSIUM(MIGRAINE) 50 MG PO PACK: 50.0000 mg | PACK | Freq: Every day | ORAL | 0 refills | Status: DC | PRN

## 2018-12-14 NOTE — Progress Notes (Signed)
Virtual Visit via Video Note   I connected with [name] on 12/14/18 at  9:45 AM EDT by a video enabled telemedicine application and verified that I am speaking with the correct person using two identifiers.  Location patient: home Location provider:home office Persons participating in the virtual visit: patient, provider  I discussed the limitations of evaluation and management by telemedicine and the availability of in person appointments. The patient expressed understanding and agreed to proceed.   HPI: He is a [id number] male with Hx of migraines and anxiety c/o bitemporal and frontal headache that started this morning around 6 AM. He has history of migraine headaches, he has not had an episode in a while.  He thinks weather changes triggered this episode. Headache was preceded by visual changes, "double vision" that lasted about 30 minutes before headache started.  He states that same visual symptom has been present in the past before migraine headaches.  Throbbing-like headache 9/10, constant. Stable since time of onset.  Associated nausea, one episode of vomiting, and photophobia.  She denies associated fever, chills, body aches, nasal congestion, runny nose, sore throat, abdominal pain, changes in bowel habits, urinary symptoms, numbness, tingling, or focal weakness.  He has not taking OTC medications.   ROS: See pertinent positives and negatives per HPI.  Past Medical History:  Diagnosis Date  . Anxiety   . Hyperlipidemia   . Migraines     Past Surgical History:  Procedure Laterality Date  . CARDIAC CATHETERIZATION  2009    No family history on file.  Social History   Socioeconomic History  . Marital status: Married    Spouse name: Not on file  . Number of children: Not on file  . Years of education: Not on file  . Highest education level: Not on file  Occupational History  . Not on file  Social Needs  . Financial resource strain: Not on file  . Food  insecurity:    Worry: Not on file    Inability: Not on file  . Transportation needs:    Medical: Not on file    Non-medical: Not on file  Tobacco Use  . Smoking status: Never Smoker  . Smokeless tobacco: Former NeurosurgeonUser    Types: Chew  Substance and Sexual Activity  . Alcohol use: Yes    Alcohol/week: 1.0 standard drinks    Types: 1 Cans of beer per week  . Drug use: No  . Sexual activity: Not on file  Lifestyle  . Physical activity:    Days per week: Not on file    Minutes per session: Not on file  . Stress: Not on file  Relationships  . Social connections:    Talks on phone: Not on file    Gets together: Not on file    Attends religious service: Not on file    Active member of club or organization: Not on file    Attends meetings of clubs or organizations: Not on file    Relationship status: Not on file  . Intimate partner violence:    Fear of current or ex partner: Not on file    Emotionally abused: Not on file    Physically abused: Not on file    Forced sexual activity: Not on file  Other Topics Concern  . Not on file  Social History Narrative  . Not on file    Current Outpatient Medications:  .  CIALIS 20 MG tablet, TAKE 1 TABLET BY MOUTH DAILY AS NEEDED  FOR ERECTILE DYSFUNCTION, Disp: 6 tablet, Rfl: 1 .  Diclofenac Potassium 50 MG PACK, Take 50 mg by mouth daily as needed (Take once for migraine headache.)., Disp: 4 each, Rfl: 0 .  ondansetron (ZOFRAN) 4 MG tablet, Take 1 tablet (4 mg total) by mouth every 8 (eight) hours as needed for nausea or vomiting., Disp: 15 tablet, Rfl: 0 .  sertraline (ZOLOFT) 100 MG tablet, TAKE 1 TABLET BY MOUTH EVERY DAY, Disp: 90 tablet, Rfl: 2  EXAM:  VITALS per patient if applicable:N/A  GENERAL: alert, oriented, appears well and in no acute distress  HEENT: atraumatic, conjunctiva clear, otherwise normal EOM bilateral,no obvious facial abnormalities on inspection.  NECK: normal movements of the head and neck  LUNGS: on  inspection no signs of respiratory distress, breathing rate appears normal, no obvious gross SOB, gasping or wheezing  CV: no obvious cyanosis  MS: moves all visible extremities without noticeable abnormality  PSYCH/NEURO: pleasant and cooperative, no obvious depression or anxiety, speech and thought processing grossly intact. No focal deficit appreciated.  ASSESSMENT AND PLAN:  Discussed the following assessment and plan:  Migraine with aura and without status migrainosus, not intractable - Plan: ondansetron (ZOFRAN) 4 MG tablet, Diclofenac Potassium 50 MG PACK  Migraine with aura and without status migrainosus, not intractable He is reporting this headache as his typical migraine headache, so I do not think brain imaging is needed at this time. Recommend diclofenac 50 mg x 1 and Zofran 4 mg 3 times daily as needed. We discussed side effects of medications. He was clearly instructed about warning signs. States that he does not need a note for work. Follow-up with PCP as needed.    I discussed the assessment and treatment plan with the patient. He was provided an opportunity to ask questions and all were answered. The patient agreed with the plan and demonstrated an understanding of the instructions.   The patient was advised to call back or seek an in-person evaluation if the symptoms worsen or if the condition fails to improve as anticipated.  Return if symptoms worsen or fail to improve.    Ader Fritze [place], MD

## 2018-12-14 NOTE — Assessment & Plan Note (Signed)
He is reporting this headache as his typical migraine headache, so I do not think brain imaging is needed at this time. Recommend diclofenac 50 mg x 1 and Zofran 4 mg 3 times daily as needed. We discussed side effects of medications. He was clearly instructed about warning signs. States that he does not need a note for work. Follow-up with PCP as needed.

## 2018-12-15 ENCOUNTER — Telehealth: Payer: Self-pay | Admitting: Adult Health

## 2018-12-15 MED ORDER — SERTRALINE HCL 100 MG PO TABS: 100.0000 mg | ORAL_TABLET | Freq: Every day | ORAL | 0 refills | Status: DC

## 2018-12-15 NOTE — Telephone Encounter (Signed)
Sent to the pharmacy by e-scribe. 

## 2018-12-15 NOTE — Telephone Encounter (Signed)
Copied from CRM [id number]. Topic: Quick Communication - Rx Refill/Question >> Dec 15, 2018  9:30 AM Louie Bun, Rosey Bath D wrote: Medication: sertraline (ZOLOFT) 100 MG tablet  Has the patient contacted their pharmacy? Yes (Agent: If no, request that the patient contact the pharmacy for the refill.) (Agent: If yes, when and what did the pharmacy advise?)  Preferred Pharmacy (with phone number or street name): [id number] - [place], [place] - [address] AT [address] OF [facility] [address]  Agent: Please be advised that RX refills may take up to 3 business days. We ask that you follow-up with your pharmacy.

## 2019-01-13 ENCOUNTER — Ambulatory Visit: Payer: Self-pay

## 2019-01-13 NOTE — Telephone Encounter (Signed)
Please check on patient to see  If she needs to come in tomorrow for an office visit (in person) or virtual visit.

## 2019-01-13 NOTE — Telephone Encounter (Signed)
Pt. Called to report receiving an electric shock on finger, while changing out an outlet box in his home, about 20 min. prior to calling the office.    Reported the outlet box is 120 V.  Stated the shock occurred for a "split second".  Reported he feels a little tired, and a little light-headed.   Also, stated there is some tingling of the finger that was affected.  Denied any signs of burn to his finger.  Denied any heart palpitations or chest discomfort. Also, stated he is not eating like normal, because he has been dieting; felt the light-headed feeling could also be related to his diet.  Reassured pt. That the electric shock from 120 V. would be considered low-voltage, and to continue to monitor for any change or worsening of symptoms.  Attempted to call [facility] close to time that Clinic was closing. Gave pt. Care advice per protocol.  Advised to go to [facility] or ER if his symptoms worsened.  Encouraged to hydrate well with water, and rest.  Advised will send triage note to office, to make Provider aware.  Verb. Understanding.   Reason for Disposition . Minor voltage electrical shock  Answer Assessment - Initial Assessment Questions 1. MECHANISM: "Tell me what happened." "What was the source of electricity?"    Changing an electrical outlet in home (light switch) ; touched a metal screw that wire attaches to.  2. ONSET: "When did it happen?"     About 15-20 min. Ago  3. SYMPTOMS: "What is the worst symptom?"      Feels tired, and a little light-headed; mild tingling in finger that rec'd shock 4. WATER: "Were you wet or standing in water at the time?"    No  5. BURN: "Did you get a burn?" (e.g., yes, no; describe location)     Denied  6. FALL: "Did you fall down or get thrown?" (e.g., yes, no; fall from standing, fall from ladder)     No  7. OTHER INJURIES: "Do you have any other injuries?" (e.g., head, neck, chest, extremity)     No  8. OTHER SYMPTOMS: "Do you have any other symptoms?" (e.g., loss of  consciousness, chest pain, palpitations)     Denied chest discomfort or fluttering in chest.  Denied any shortness of breath  9. PREGNANCY: "Is there any chance you are pregnant?" "When was your last menstrual period?"     n/a  Protocols used: ELECTRIC SHOCK OR LIGHTNING INJURY-A-AH

## 2019-01-14 NOTE — Telephone Encounter (Signed)
Called and spoke to Edward Nash.  He states he is feeling normal this morning.  He thinks resting overnight has helped.  Advised if he started feeling fatigued and light headed again than he should call the office to schedule an appointment.  Nothing further needed at this time.

## 2019-02-02 ENCOUNTER — Other Ambulatory Visit: Payer: Self-pay | Admitting: Family Medicine

## 2019-02-02 DIAGNOSIS — G43109 Migraine with aura, not intractable, without status migrainosus: Secondary | ICD-10-CM

## 2019-02-09 ENCOUNTER — Other Ambulatory Visit: Payer: Self-pay | Admitting: Family Medicine

## 2019-02-09 DIAGNOSIS — G43109 Migraine with aura, not intractable, without status migrainosus: Secondary | ICD-10-CM

## 2019-02-17 ENCOUNTER — Encounter: Payer: Self-pay | Admitting: Adult Health

## 2019-02-17 ENCOUNTER — Ambulatory Visit (INDEPENDENT_AMBULATORY_CARE_PROVIDER_SITE_OTHER): Payer: BC Managed Care – PPO | Admitting: Adult Health

## 2019-02-17 ENCOUNTER — Other Ambulatory Visit: Payer: Self-pay

## 2019-02-17 DIAGNOSIS — G43109 Migraine with aura, not intractable, without status migrainosus: Secondary | ICD-10-CM | POA: Diagnosis not present

## 2019-02-17 DIAGNOSIS — F419 Anxiety disorder, unspecified: Secondary | ICD-10-CM

## 2019-02-17 MED ORDER — SERTRALINE HCL 25 MG PO TABS: 25.0000 mg | ORAL_TABLET | Freq: Every day | ORAL | 0 refills | Status: DC

## 2019-02-17 MED ORDER — DICLOFENAC POTASSIUM(MIGRAINE) 50 MG PO PACK: 50.0000 mg | PACK | Freq: Every day | ORAL | 6 refills | Status: DC | PRN

## 2019-02-17 NOTE — Progress Notes (Signed)
Virtual Visit via Video Note  I connected with Jyl Heinz on 02/17/19 at  1:30 PM EDT by a video enabled telemedicine application and verified that I am speaking with the correct person using two identifiers.  Location patient: home Location provider:work or home office Persons participating in the virtual visit: patient, provider  I discussed the limitations of evaluation and management by telemedicine and the availability of in person appointments. The patient expressed understanding and agreed to proceed.   HPI: 43 year old male who is being evaluated today for 2 separate issues.  He was seen by another provider in the office in May for migraine and was prescribed Voltaren.  Reports that this medication worked well for him to help abort his migraine headaches.  He is currently out of the medication and he would like a refill to keep on hand.  Last migraine was about 6 weeks ago  Additionally he would like to start weaning himself off Zoloft.  He is currently on 100 mg daily and does not feel as though he needs it any longer.  He tried coming off this medication cold [place] but went through withdrawal symptoms so he restarted it.   ROS: See pertinent positives and negatives per HPI.  Past Medical History:  Diagnosis Date  . Anxiety   . Hyperlipidemia   . Migraines     Past Surgical History:  Procedure Laterality Date  . CARDIAC CATHETERIZATION  2009    No family history on file.     Current Outpatient Medications:  .  CIALIS 20 MG tablet, TAKE 1 TABLET BY MOUTH DAILY AS NEEDED FOR ERECTILE DYSFUNCTION, Disp: 6 tablet, Rfl: 1 .  Diclofenac Potassium 50 MG PACK, Take 50 mg by mouth daily as needed (Take once for migraine headache.)., Disp: 4 each, Rfl: 0 .  ondansetron (ZOFRAN) 4 MG tablet, Take 1 tablet (4 mg total) by mouth every 8 (eight) hours as needed for nausea or vomiting., Disp: 15 tablet, Rfl: 0 .  sertraline (ZOLOFT) 100 MG tablet, Take 1 tablet (100 mg total)  by mouth daily., Disp: 90 tablet, Rfl: 0  EXAM:  VITALS per patient if applicable:  GENERAL: alert, oriented, appears well and in no acute distress  HEENT: atraumatic, conjunttiva clear, no obvious abnormalities on inspection of external nose and ears  NECK: normal movements of the head and neck  LUNGS: on inspection no signs of respiratory distress, breathing rate appears normal, no obvious gross SOB, gasping or wheezing  CV: no obvious cyanosis  MS: moves all visible extremities without noticeable abnormality  PSYCH/NEURO: pleasant and cooperative, no obvious depression or anxiety, speech and thought processing grossly intact  ASSESSMENT AND PLAN:  Discussed the following assessment and plan:  1. Migraine with aura and without status migrainosus, not intractable  - Diclofenac Potassium 50 MG PACK; Take 50 mg by mouth daily as needed (Take once for migraine headache.).  Dispense: 4 each; Refill: 6  2. Anxiety -We reviewed taper instructions.  He will start cutting his for 100 mg tablets in half and take 50 mg for 2 weeks and then bumped down to 25 mg for 2 weeks after this he will start skipping days between doses until he is no longer going through withdrawal - sertraline (ZOLOFT) 25 MG tablet; Take 1 tablet (25 mg total) by mouth daily.  Dispense: 30 tablet; Refill: 0     I discussed the assessment and treatment plan with the patient. The patient was provided an opportunity to ask questions and  all were answered. The patient agreed with the plan and demonstrated an understanding of the instructions.   The patient was advised to call back or seek an in-person evaluation if the symptoms worsen or if the condition fails to improve as anticipated.   Shirline Frees, NP

## 2019-03-09 ENCOUNTER — Other Ambulatory Visit: Payer: Self-pay | Admitting: Adult Health

## 2019-03-09 NOTE — Telephone Encounter (Signed)
Denied.  Will need to speak to the pt if refill is needed.  Dur[place] last visit pt was tapering off of medication.  Refill may not be needed.

## 2019-03-12 ENCOUNTER — Other Ambulatory Visit: Payer: Self-pay | Admitting: Adult Health

## 2019-03-12 DIAGNOSIS — F419 Anxiety disorder, unspecified: Secondary | ICD-10-CM

## 2019-03-16 NOTE — Telephone Encounter (Signed)
Spoke to the pt.  He is currently cutting 100 mg in half.  Has not started the 25 mg rx.  No refill needed at this time.

## 2019-03-16 NOTE — Telephone Encounter (Signed)
Can we see how he is doing with weaning off this medication?

## 2019-03-22 ENCOUNTER — Other Ambulatory Visit: Payer: Self-pay

## 2019-03-22 ENCOUNTER — Emergency Department (HOSPITAL_COMMUNITY): Payer: BC Managed Care – PPO

## 2019-03-22 ENCOUNTER — Telehealth: Payer: Self-pay | Admitting: *Deleted

## 2019-03-22 ENCOUNTER — Ambulatory Visit: Payer: Self-pay | Admitting: *Deleted

## 2019-03-22 ENCOUNTER — Encounter (HOSPITAL_COMMUNITY): Payer: Self-pay | Admitting: Emergency Medicine

## 2019-03-22 ENCOUNTER — Emergency Department (HOSPITAL_COMMUNITY)
Admission: EM | Admit: 2019-03-22 | Discharge: 2019-03-22 | Disposition: A | Discharge status: Home / Self Care | Payer: BC Managed Care – PPO | Attending: Emergency Medicine | Admitting: Emergency Medicine

## 2019-03-22 DIAGNOSIS — N201 Calculus of ureter: Secondary | ICD-10-CM

## 2019-03-22 DIAGNOSIS — Z79899 Other long term (current) drug therapy: Secondary | ICD-10-CM | POA: Insufficient documentation

## 2019-03-22 DIAGNOSIS — Z87891 Personal history of nicotine dependence: Secondary | ICD-10-CM | POA: Insufficient documentation

## 2019-03-22 DIAGNOSIS — R1031 Right lower quadrant pain: Secondary | ICD-10-CM | POA: Diagnosis present

## 2019-03-22 DIAGNOSIS — N132 Hydronephrosis with renal and ureteral calculous obstruction: Secondary | ICD-10-CM | POA: Diagnosis not present

## 2019-03-22 LAB — COMPREHENSIVE METABOLIC PANEL
ALT: 17 U/L (ref 0–44)
AST: 18 U/L (ref 15–41)
Albumin: 4.4 g/dL (ref 3.5–5.0)
Alkaline Phosphatase: 45 U/L (ref 38–126)
Anion gap: 11 (ref 5–15)
BUN: 14 mg/dL (ref 6–20)
CO2: 23 mmol/L (ref 22–32)
Calcium: 9.3 mg/dL (ref 8.9–10.3)
Chloride: 105 mmol/L (ref 98–111)
Creatinine, Ser: 1.04 mg/dL (ref 0.61–1.24)
GFR calc Af Amer: >60 mL/min (ref >60)
GFR calc non Af Amer: >60 mL/min (ref >60)
Glucose, Bld: 107 mg/dL — ABNORMAL HIGH (ref 70–99)
Potassium: 3.9 mmol/L (ref 3.5–5.1)
Sodium: 139 mmol/L (ref 135–145)
Total Bilirubin: 1 mg/dL (ref 0.3–1.2)
Total Protein: 6.7 g/dL (ref 6.5–8.1)

## 2019-03-22 LAB — URINALYSIS, ROUTINE W REFLEX MICROSCOPIC
Bacteria, UA: NONE SEEN
Bilirubin Urine: NEGATIVE
Glucose, UA: NEGATIVE mg/dL
Ketones, ur: 20 mg/dL — AB
Leukocytes,Ua: NEGATIVE
Nitrite: NEGATIVE
Protein, ur: NEGATIVE mg/dL
Specific Gravity, Urine: 1.012 (ref 1.005–1.030)
pH: 5 (ref 5.0–8.0)

## 2019-03-22 LAB — CBC
HCT: 46.3 % (ref 39.0–52.0)
Hemoglobin: 14.9 g/dL (ref 13.0–17.0)
MCH: 29.2 pg (ref 26.0–34.0)
MCHC: 32.2 g/dL (ref 30.0–36.0)
MCV: 90.8 fL (ref 80.0–100.0)
Platelets: 224 10*3/uL (ref 150–400)
RBC: 5.1 MIL/uL (ref 4.22–5.81)
RDW: 12.1 % (ref 11.5–15.5)
WBC: 8.4 10*3/uL (ref 4.0–10.5)
nRBC: 0 % (ref 0.0–0.2)

## 2019-03-22 LAB — LIPASE, BLOOD: Lipase: 30 U/L (ref 11–51)

## 2019-03-22 MED ORDER — KETOROLAC TROMETHAMINE 10 MG PO TABS: 10.0000 mg | ORAL_TABLET | Freq: Four times a day (QID) | ORAL | 0 refills | Status: DC | PRN

## 2019-03-22 MED ORDER — OXYCODONE-ACETAMINOPHEN 5-325 MG PO TABS: 1.0000 | ORAL_TABLET | Freq: Four times a day (QID) | ORAL | 0 refills | Status: DC | PRN

## 2019-03-22 MED ORDER — SODIUM CHLORIDE 0.9% FLUSH: 3.0000 mL | Freq: Once | INTRAVENOUS | Status: DC

## 2019-03-22 MED ORDER — KETOROLAC TROMETHAMINE 30 MG/ML IJ SOLN
30.0000 mg | Freq: Once | INTRAMUSCULAR | Status: AC
  Administered 2019-03-22: 30 mg via INTRAMUSCULAR
  Filled 2019-03-22: qty 1

## 2019-03-22 MED ORDER — TAMSULOSIN HCL 0.4 MG PO CAPS: 0.4000 mg | ORAL_CAPSULE | Freq: Every day | ORAL | 0 refills | Status: DC

## 2019-03-22 NOTE — ED Triage Notes (Signed)
Pt states last night he got a sudden onset of right flank pain that radiates into RLQ. Pt states he has been on a low carb diet and ate a lot of pork rinds right before this happen.

## 2019-03-22 NOTE — ED Notes (Signed)
Patient transported to CT 

## 2019-03-22 NOTE — Telephone Encounter (Signed)
Pt called with complaints stabbing abdominal pain that started 1800 03/21/2019; he states that he ate back beans; it started in the right lower back, and moved to his right lower abdomen at the umbilicus; his pain is rated 8 out of 10, and is constant; the pt says that he has drank a lot of water, and he feels constipated since yesterday, it is also tender to the touch; recommendations made per nurse triage protocol; he verbalized understanding and will proceed to the ED; the pt sees [employer], [employer]; will route to office for notification.  Reason for Disposition . [1] SEVERE pain (e.g., excruciating) AND [2] present > 1 hour  Answer Assessment - Initial Assessment Questions 1. LOCATION: "Where does it hurt?"     right Lower back to abdomen 2. RADIATION: "Does the pain shoot anywhere else?" (e.g., chest, back)    Right lower back 3. ONSET: "When did the pain begin?" (Minutes, hours or days ago)      03/21/2019 at 1800 4. SUDDEN: "Gradual or sudden onset?"     suddenly 5. PATTERN "Does the pain come and go, or is it constant?"    - If constant: "Is it getting better, staying the same, or worsening?"      (Note: Constant means the pain never goes away completely; most serious pain is constant and it progresses)     - If intermittent: "How long does it last?" "Do you have pain now?"     (Note: Intermittent means the pain goes away completely between bouts)     constant 6. SEVERITY: "How bad is the pain?"  (e.g., Scale 1-10; mild, moderate, or severe)    - MILD (1-3): doesn't interfere with normal activities, abdomen soft and not tender to touch     - MODERATE (4-7): interferes with normal activities or awakens from sleep, tender to touch     - SEVERE (8-10): excruciating pain, doubled over, unable to do any normal activities       8 out of 10 7. RECURRENT SYMPTOM: "Have you ever had this type of abdominal pain before?" If so, ask: "When was the last time?" and "What happened that  time?"      no 8. CAUSE: "What do you think is causing the abdominal pain?"     Not sure 9. RELIEVING/AGGRAVATING FACTORS: "What makes it better or worse?" (e.g., movement, antacids, bowel movement)    culturale probiotic 10. OTHER SYMPTOMS: "Has there been any vomiting, diarrhea, constipation, or urine problems?"       constipation  Protocols used: ABDOMINAL PAIN - MALE-A-AH

## 2019-03-22 NOTE — Discharge Instructions (Addendum)
Please read attached information. If you experience any new or worsening signs or symptoms please return to the emergency room for evaluation. Please follow-up with your primary care provider or specialist as discussed. Please use medication prescribed only as directed and discontinue taking if you have any concerning signs or symptoms.   °

## 2019-03-22 NOTE — ED Provider Notes (Signed)
[facility] EMERGENCY DEPARTMENT Provider Note   CSN: [id number] Arrival date & time: 03/22/19  2820     History   Chief Complaint Chief Complaint  Patient presents with  . Flank Pain    HPI Edward Nash is a 43 y.o. male.     HPI   24 YOM presents today with complaints of flank pain.  Patient notes symptoms started last night with acute onset of sharp pain in his right flank and right lower abdomen.  He notes the pain was severe causing nausea no vomiting.  He notes the pain has eased off slightly but still has a dull ache in that area.  He denies any urinary symptoms fever or vomiting presently.  No history of the same no history of kidney stones.    Past Medical History:  Diagnosis Date  . Anxiety   . Hyperlipidemia   . Migraines     Patient Active Problem List   Diagnosis Date Noted  . Migraine with aura and without status migrainosus, not intractable 12/14/2018    Past Surgical History:  Procedure Laterality Date  . CARDIAC CATHETERIZATION  2009        Home Medications    Prior to Admission medications   Medication Sig Start Date End Date Taking? Authorizing Provider  CIALIS 20 MG tablet TAKE 1 TABLET BY MOUTH DAILY AS NEEDED FOR ERECTILE DYSFUNCTION 09/21/17   Gordy Savers, MD  Diclofenac Potassium 50 MG PACK Take 50 mg by mouth daily as needed (Take once for migraine headache.). 02/17/19   Shirline Frees, NP  ketorolac (TORADOL) 10 MG tablet Take 1 tablet (10 mg total) by mouth every 6 (six) hours as needed. 03/22/19   Kynan Peasley, Tinnie Gens, PA-C  ondansetron (ZOFRAN) 4 MG tablet Take 1 tablet (4 mg total) by mouth every 8 (eight) hours as needed for nausea or vomiting. 12/14/18   [place], Betty G, MD  oxyCODONE-acetaminophen (PERCOCET/ROXICET) 5-325 MG tablet Take 1 tablet by mouth every 6 (six) hours as needed. 03/22/19   Alayza Pieper, Tinnie Gens, PA-C  sertraline (ZOLOFT) 25 MG tablet Take 1 tablet (25 mg total) by mouth daily. 02/17/19  03/19/19  Nafziger, Kandee Keen, NP  tamsulosin (FLOMAX) 0.4 MG CAPS capsule Take 1 capsule (0.4 mg total) by mouth daily. 03/22/19   Eyvonne Mechanic, PA-C    Family History No family history on file.  Social History Social History   Tobacco Use  . Smoking status: Never Smoker  . Smokeless tobacco: Former Neurosurgeon    Types: Chew  Substance Use Topics  . Alcohol use: Yes    Alcohol/week: 1.0 standard drinks    Types: 1 Cans of beer per week  . Drug use: No     Allergies   Sulfa antibiotics   Review of Systems Review of Systems  All other systems reviewed and are negative.   Physical Exam Updated Vital Signs BP 115/67 (BP Location: Right Arm)   Pulse 70   Temp 98.6 F (37 C) (Oral)   Resp 16   SpO2 100%   Physical Exam Vitals signs and nursing note reviewed.  Constitutional:      Appearance: He is well-developed.  HENT:     Head: Normocephalic and atraumatic.  Eyes:     General: No scleral icterus.       Right eye: No discharge.        Left eye: No discharge.     Conjunctiva/sclera: Conjunctivae normal.     Pupils: Pupils are equal, round,  and reactive to light.  Neck:     Musculoskeletal: Normal range of motion.     Vascular: No JVD.     Trachea: No tracheal deviation.  Pulmonary:     Effort: Pulmonary effort is normal.     Breath sounds: No stridor.  Abdominal:     General: There is no distension.     Palpations: Abdomen is soft. There is no mass.     Tenderness: There is no abdominal tenderness. There is no guarding.  Neurological:     Mental Status: He is alert and oriented to person, place, and time.     Coordination: Coordination normal.  Psychiatric:        Behavior: Behavior normal.        Thought Content: Thought content normal.        Judgment: Judgment normal.    ED Treatments / Results  Labs (all labs ordered are listed, but only abnormal results are displayed) Labs Reviewed  COMPREHENSIVE METABOLIC PANEL - Abnormal; Notable for the following  components:      Result Value   Glucose, Bld 107 (*)    All other components within normal limits  URINALYSIS, ROUTINE W REFLEX MICROSCOPIC - Abnormal; Notable for the following components:   Hgb urine dipstick LARGE (*)    Ketones, ur 20 (*)    All other components within normal limits  LIPASE, BLOOD  CBC    EKG None  Radiology Ct Renal Stone Study  Result Date: 03/22/2019 CLINICAL DATA:  Right flank pain with microscopic hematuria EXAM: CT ABDOMEN AND PELVIS WITHOUT CONTRAST TECHNIQUE: Multidetector CT imaging of the abdomen and pelvis was performed following the standard protocol without oral or IV contrast. COMPARISON:  None. FINDINGS: Lower chest: Lung bases are clear. Hepatobiliary: There is mild fatty infiltration near the fissure for the ligamentum teres. No focal liver lesions are evident on this noncontrast enhanced study. Gallbladder wall does not appear appreciably thickened. There is no biliary duct dilatation. Pancreas: There is no pancreatic mass or inflammatory focus. Spleen: No splenic lesions are evident. Adrenals/Urinary Tract: Adrenals bilaterally appear normal. There is no appreciable renal mass on either side. Right kidney is subtly edematous. There is mild hydronephrosis on the right. There is no hydronephrosis on the left. There is no evident intrarenal calculus on either side. There is a 3 x 3 mm calculus in the right ureter at the L4-5 level. No other ureteral calculi evident on either side. Urinary bladder is midline with wall thickness within normal limits. Stomach/Bowel: There is no appreciable bowel wall or mesenteric thickening. There is no evident bowel obstruction. Terminal ileum appears unremarkable. There is no evident free air or portal venous air. Vascular/Lymphatic: There is no abdominal aortic aneurysm. No vascular lesions are evident on this noncontrast enhanced study. No adenopathy is evident in the abdomen or pelvis. Reproductive: Prostate and seminal  vesicles are normal in size and contour. There is no pelvic mass evident. Other: Appendix appears normal. There is no evident abscess or ascites in the abdomen or pelvis. Musculoskeletal: There are pars defects at L5 bilaterally. There is 7 mm of anterolisthesis of L5 on S1. There is 2 mm of retrolisthesis of L4 on L5. There is degenerative change in the lower lumbar region. There are no blastic or lytic bone lesions. There is no intramuscular or abdominal wall lesion. IMPRESSION: 1. 3 x 3 mm calculus in the right ureter at the L4-5 level with mild hydronephrosis on the right. There is mild renal  edema on the right. 2. No evident bowel obstruction. No abscess in the abdomen or pelvis. Appendix appears normal. 3. 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 Electronically Signed   By: Lowella Grip III M.D.   On: 03/22/2019 13:05    Procedures Procedures (including critical care time)  Medications Ordered in ED Medications  ketorolac (TORADOL) 30 MG/ML injection 30 mg (30 mg Intramuscular Given 03/22/19 1138)     Initial Impression / Assessment and Plan / ED Course  I have reviewed the triage vital signs and the nursing notes.  Pertinent labs & imaging results that were available during my care of the patient were reviewed by me and considered in my medical decision making (see chart for details).        43 year old male presents today with ureterolithiasis.  This is a small stone with no acute complicating features.  He does have some minor hydronephrosis no signs of infection.  Patient had complete resolution of pain with Toradol.  He has no contraindication to Toradol and will be discharged with Toradol, Percocet, Flomax and return precautions and outpatient follow-up.     Final Clinical Impressions(s) / ED Diagnoses   Final diagnoses:  Ureterolithiasis    ED Discharge Orders         Ordered    ketorolac (TORADOL) 10 MG tablet   Every 6 hours PRN     03/22/19 1443    oxyCODONE-acetaminophen (PERCOCET/ROXICET) 5-325 MG tablet  Every 6 hours PRN     03/22/19 1443    tamsulosin (FLOMAX) 0.4 MG CAPS capsule  Daily     03/22/19 1443           Okey Regal, PA-C 03/23/19 1420    Sherwood Gambler, MD 03/24/19 [phone]

## 2019-03-22 NOTE — Telephone Encounter (Signed)
See note

## 2019-03-22 NOTE — Telephone Encounter (Signed)
Pt in ED.  Nothing further needed.

## 2019-03-22 NOTE — Telephone Encounter (Signed)
TOC CM received call from [facility], and states Toradol will interact with Zoloft. Contacted pt to make aware and to follow up with his PCP for an alternate med for Toradol and follow up appt. Explained [facility] will not fill Toradol Rx written from ED setting. Pt verbalized understanding and will follow up with his PCP.Jonnie Finner RN [facility] Case Mgmt phone [phone]

## 2019-04-09 ENCOUNTER — Other Ambulatory Visit: Payer: Self-pay | Admitting: Adult Health

## 2019-04-09 DIAGNOSIS — F419 Anxiety disorder, unspecified: Secondary | ICD-10-CM

## 2019-04-24 ENCOUNTER — Other Ambulatory Visit: Payer: Self-pay | Admitting: Adult Health

## 2019-04-24 DIAGNOSIS — F419 Anxiety disorder, unspecified: Secondary | ICD-10-CM

## 2019-04-26 DIAGNOSIS — F419 Anxiety disorder, unspecified: Secondary | ICD-10-CM | POA: Insufficient documentation

## 2019-04-26 NOTE — Telephone Encounter (Signed)
Prescription has been filled.  Pharmacy needed dx code.  Sent by e-scribe in message to pharmacy.

## 2019-08-24 ENCOUNTER — Ambulatory Visit: Payer: BC Managed Care – PPO | Attending: Internal Medicine

## 2019-08-24 DIAGNOSIS — Z20822 Contact with and (suspected) exposure to covid-19: Secondary | ICD-10-CM

## 2019-08-25 LAB — NOVEL CORONAVIRUS, NAA: SARS-CoV-2, NAA: NOT DETECTED

## 2019-10-27 ENCOUNTER — Encounter: Payer: Self-pay | Admitting: Family Medicine

## 2019-10-27 ENCOUNTER — Encounter: Payer: Self-pay | Admitting: Adult Health

## 2020-01-25 ENCOUNTER — Telehealth: Payer: Self-pay | Admitting: Adult Health

## 2020-01-25 MED ORDER — TADALAFIL 20 MG PO TABS: ORAL_TABLET | ORAL | 0 refills | Status: DC

## 2020-01-25 NOTE — Telephone Encounter (Signed)
Ok for 5 tabs but no additional medication refills until he has a CPE

## 2020-01-25 NOTE — Telephone Encounter (Signed)
5 TABS SENT TO THE PHARMACY BY E-SCRIBE.  MESSAGE ATTACHED THAT HE IS DUE FOR CPX.

## 2020-01-25 NOTE — Telephone Encounter (Addendum)
Pt stated he leaves out of town this Friday and would like it refilled before then.   Last OV: 02/17/2019 virtually Last refill: 09/21/2017 by Dr. Kirtland Bouchard  Medication Refill:  Cialis  Pharmacy:  [id number] - [place], [place] - [address] AT [facility] [address] [facility] [address] Phone:  [phone]  Fax:  [phone]

## 2020-01-25 NOTE — Telephone Encounter (Signed)
No cpx since 2019.  Please advise.

## 2020-02-08 ENCOUNTER — Encounter: Payer: Self-pay | Admitting: Adult Health

## 2020-02-08 ENCOUNTER — Ambulatory Visit: Payer: BC Managed Care – PPO | Admitting: Adult Health

## 2020-02-08 ENCOUNTER — Telehealth: Payer: Self-pay | Admitting: Adult Health

## 2020-02-08 ENCOUNTER — Other Ambulatory Visit: Payer: Self-pay

## 2020-02-08 VITALS — BP 106/78 | Temp 98.6°F | Wt 247.0 lb

## 2020-02-08 DIAGNOSIS — R358 Other polyuria: Secondary | ICD-10-CM

## 2020-02-08 DIAGNOSIS — R631 Polydipsia: Secondary | ICD-10-CM | POA: Diagnosis not present

## 2020-02-08 DIAGNOSIS — E782 Mixed hyperlipidemia: Secondary | ICD-10-CM | POA: Diagnosis not present

## 2020-02-08 DIAGNOSIS — R200 Anesthesia of skin: Secondary | ICD-10-CM | POA: Diagnosis not present

## 2020-02-08 DIAGNOSIS — R3589 Other polyuria: Secondary | ICD-10-CM

## 2020-02-08 NOTE — Progress Notes (Signed)
Subjective:    Patient ID: Edward Nash, male    DOB: January 14, 1976, 44 y.o.   MRN: [id number]  HPI 44 year old male who  has a past medical history of Anxiety, Hyperlipidemia, and Migraines.  He presents to the office today for an acute issue of frequent urination, up to 10 times a day, feeling thirsty all the time, and fatigue.  He reports that his symptoms have been present for the last 3 to 4 months but has not been every day.  He does endorse decreased stream but no nocturia no symptoms of UTI.  Throughout the day he will have about 4 glasses of water in 2 to 3 cups of coffee does feel as though input equals output in regards to urination.  His diet is high in salt and he eats out quite often.  He does have a history of diabetes in his family  Additionally, he reports episodes of numbness/tingling, and cramping in his right toe and foot.  The symptoms have been present for about 1 year and started after he got over sciatica.  He has no worse pain with walking, weightbearing, or sitting.  Has not noticed any discoloration in his right foot nor does he endorse swelling or redness.  He has a history of high cholesterol and would like his cholesterol checked    Review of Systems  See HPI   Past Medical History:  Diagnosis Date  . Anxiety   . Hyperlipidemia   . Migraines     Social History   Socioeconomic History  . Marital status: Married    Spouse name: Not on file  . Number of children: Not on file  . Years of education: Not on file  . Highest education level: Not on file  Occupational History  . Not on file  Tobacco Use  . Smoking status: Never Smoker  . Smokeless tobacco: Former Neurosurgeon    Types: Chew  Substance and Sexual Activity  . Alcohol use: Yes    Alcohol/week: 1.0 standard drink    Types: 1 Cans of beer per week  . Drug use: No  . Sexual activity: Not on file  Other Topics Concern  . Not on file  Social History Narrative  . Not on file   Social  Determinants of Health   Financial Resource Strain:   . Difficulty of Paying Living Expenses:   Food Insecurity:   . Worried About Programme researcher, broadcasting/film/video in the Last Year:   . Barista in the Last Year:   Transportation Needs:   . Freight forwarder (Medical):   Marland Kitchen Lack of Transportation (Non-Medical):   Physical Activity:   . Days of Exercise per Week:   . Minutes of Exercise per Session:   Stress:   . Feeling of Stress :   Social Connections:   . Frequency of Communication with Friends and Family:   . Frequency of Social Gatherings with Friends and Family:   . Attends Religious Services:   . Active Member of Clubs or Organizations:   . Attends Banker Meetings:   Marland Kitchen Marital Status:   Intimate Partner Violence:   . Fear of Current or Ex-Partner:   . Emotionally Abused:   Marland Kitchen Physically Abused:   . Sexually Abused:     Past Surgical History:  Procedure Laterality Date  . CARDIAC CATHETERIZATION  2009    History reviewed. No pertinent family history.  Allergies  Allergen Reactions  .  Sulfa Antibiotics Rash    Current Outpatient Medications on File Prior to Visit  Medication Sig Dispense Refill  . tadalafil (CIALIS) 20 MG tablet Take 1 tablet by mouth as needed.  **DUE FOR YEARLY PHYSICAL** 5 tablet 0   No current facility-administered medications on file prior to visit.    BP 106/78   Temp 98.6 F (37 C)   Wt 247 lb (112 kg)   BMI 32.15 kg/m       Objective:   Physical Exam Vitals and nursing note reviewed.  Constitutional:      Appearance: Normal appearance.  Musculoskeletal:        General: No swelling or tenderness. Normal range of motion.     Right foot: Normal. Normal range of motion and normal capillary refill. No swelling, tenderness, bony tenderness or crepitus. Normal pulse.  Skin:    General: Skin is warm and dry.  Neurological:     General: No focal deficit present.     Mental Status: He is alert and oriented to person,  place, and time.  Psychiatric:        Mood and Affect: Mood normal.        Thought Content: Thought content normal.        Judgment: Judgment normal.       Assessment & Plan:  1. Polydipsia -Since symptoms are not happening on a daily basis, doubt diabetes insipidus.  Can consider MRI, 24-hour urine, and referral to endocrinology if needed - Hemoglobin A1c - TSH - Lipid panel - CMP - Osmolality, urine - Urinalysis - Vitamin B12  2. Polyuria  - Hemoglobin A1c - TSH - Lipid panel - CMP - Osmolality, urine - Urinalysis - Vitamin B12  3. Numbness of right foot - possible neuropathy.  No signs of neuroma.  Right foot is completely normal on exam - Hemoglobin A1c - TSH - Lipid panel - CMP - Osmolality, urine - Urinalysis - Vitamin B12  4. Mixed hyperlipidemia - Consider statin    Shirline Frees, NP

## 2020-02-08 NOTE — Patient Instructions (Signed)
It was great seeing you today   We will follow up with you regarding your blood work    

## 2020-02-08 NOTE — Telephone Encounter (Signed)
Disregard

## 2020-02-10 ENCOUNTER — Other Ambulatory Visit: Payer: Self-pay | Admitting: Family Medicine

## 2020-02-10 LAB — OSMOLALITY, URINE: Osmolality, Ur: 706 mOsm/kg (ref 50–1200)

## 2020-02-10 LAB — LIPID PANEL
Cholesterol: 236 mg/dL — ABNORMAL HIGH (ref <200)
HDL: 66 mg/dL (ref >=40)
LDL Cholesterol (Calc): 147 mg/dL (calc) — ABNORMAL HIGH
Non-HDL Cholesterol (Calc): 170 mg/dL (calc) — ABNORMAL HIGH (ref <130)
Total CHOL/HDL Ratio: 3.6 (calc) (ref <5.0)
Triglycerides: 115 mg/dL (ref <150)

## 2020-02-10 LAB — COMPREHENSIVE METABOLIC PANEL
AG Ratio: 2 (calc) (ref 1.0–2.5)
ALT: 36 U/L (ref 9–46)
AST: 23 U/L (ref 10–40)
Albumin: 4.7 g/dL (ref 3.6–5.1)
Alkaline phosphatase (APISO): 50 U/L (ref 36–130)
BUN: 19 mg/dL (ref 7–25)
CO2: 29 mmol/L (ref 20–32)
Calcium: 10 mg/dL (ref 8.6–10.3)
Chloride: 103 mmol/L (ref 98–110)
Creat: 1.08 mg/dL (ref 0.60–1.35)
Globulin: 2.3 g/dL (calc) (ref 1.9–3.7)
Glucose, Bld: 105 mg/dL — ABNORMAL HIGH (ref 65–99)
Potassium: 4.6 mmol/L (ref 3.5–5.3)
Sodium: 140 mmol/L (ref 135–146)
Total Bilirubin: 0.7 mg/dL (ref 0.2–1.2)
Total Protein: 7 g/dL (ref 6.1–8.1)

## 2020-02-10 LAB — URINALYSIS
Bilirubin Urine: NEGATIVE
Glucose, UA: NEGATIVE
Hgb urine dipstick: NEGATIVE
Ketones, ur: NEGATIVE
Leukocytes,Ua: NEGATIVE
Nitrite: NEGATIVE
Protein, ur: NEGATIVE
Specific Gravity, Urine: 1.018 (ref 1.001–1.03)
pH: 8 (ref 5.0–8.0)

## 2020-02-10 LAB — HEMOGLOBIN A1C
Hgb A1c MFr Bld: 5.3 % of total Hgb (ref <5.7)
Mean Plasma Glucose: 105 (calc)
eAG (mmol/L): 5.8 (calc)

## 2020-02-10 LAB — TSH: TSH: 2.55 mIU/L (ref 0.40–4.50)

## 2020-02-10 LAB — VITAMIN B12: Vitamin B-12: 588 pg/mL (ref 200–1100)

## 2020-02-10 MED ORDER — ROSUVASTATIN CALCIUM 20 MG PO TABS: 20.0000 mg | ORAL_TABLET | Freq: Every day | ORAL | 3 refills | Status: DC

## 2020-02-19 ENCOUNTER — Encounter: Payer: Self-pay | Admitting: Adult Health

## 2020-02-20 ENCOUNTER — Telehealth: Payer: Self-pay | Admitting: Adult Health

## 2020-02-20 NOTE — Telephone Encounter (Signed)
Pt said he needs to go back on the Sertraline and wants to get a prescription for it.  Pt number [phone]  Please advise

## 2020-02-21 ENCOUNTER — Other Ambulatory Visit: Payer: Self-pay | Admitting: Adult Health

## 2020-02-21 MED ORDER — SERTRALINE HCL 50 MG PO TABS: 50.0000 mg | ORAL_TABLET | Freq: Every day | ORAL | 0 refills | Status: DC

## 2020-02-21 NOTE — Telephone Encounter (Signed)
Pt also sent in a MyChart message.  Cory did respond back my Mychart.  Below is the message he sent the pt.  Nothing further needed.  Betsey Holiday,   I am not in the office on Sunday or Monday,so I am just seeing this message now. We can restart you on Zoloft. I will go head and send in 50 mg tabs and can titrate up to 100 mg if you feel like it is needed after a month.   Did I give you the phone number for [facility]?  Kandee Keen

## 2020-03-14 ENCOUNTER — Other Ambulatory Visit: Payer: Self-pay | Admitting: Adult Health

## 2020-05-12 IMAGING — CT CT RENAL STONE PROTOCOL
2 of 4 series · 16 of 46 positions shown, 18 images · non-contrast
Comparison: None.

CLINICAL DATA: Right flank pain with microscopic hematuria

EXAM:
CT ABDOMEN AND PELVIS WITHOUT CONTRAST
TECHNIQUE: Multidetector CT imaging of the abdomen and pelvis was performed
following the standard protocol without oral or IV contrast.

[Series 3: renal stone 5.0 · axial · 0.95mm/px · z∈[+936,+1432]mm · 13 of 109 slices shown, 15 images]
[im 5/109  soft-tissue]
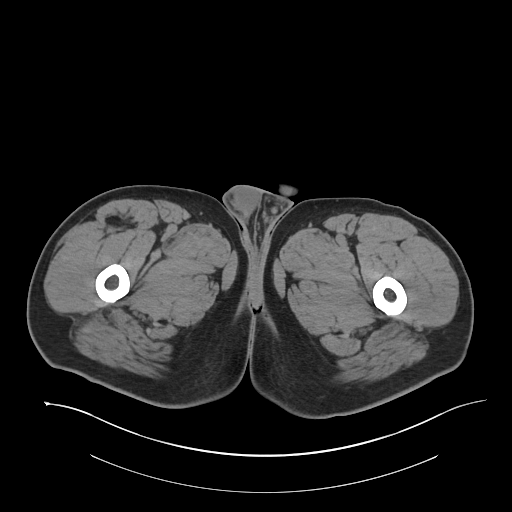
[im 5/109  bone]
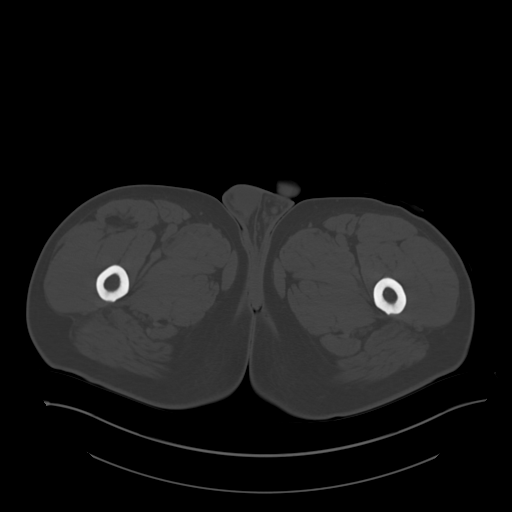
[im 14/109  soft-tissue]
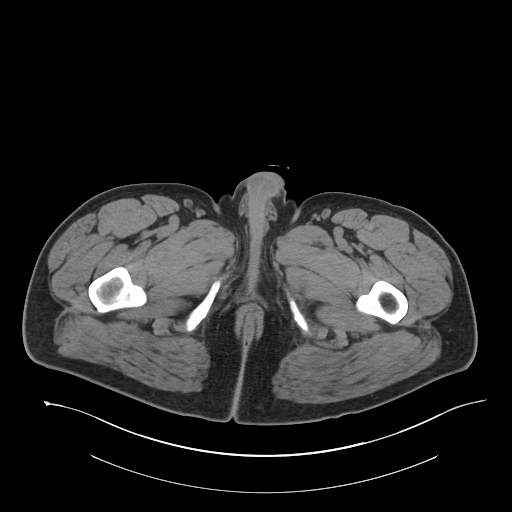
[im 23/109  soft-tissue]
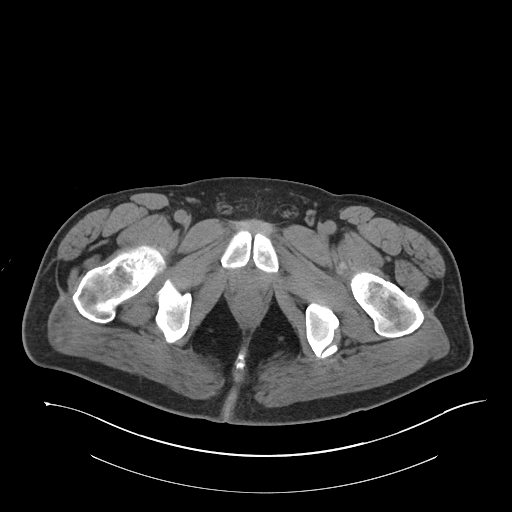
[im 32/109  soft-tissue]
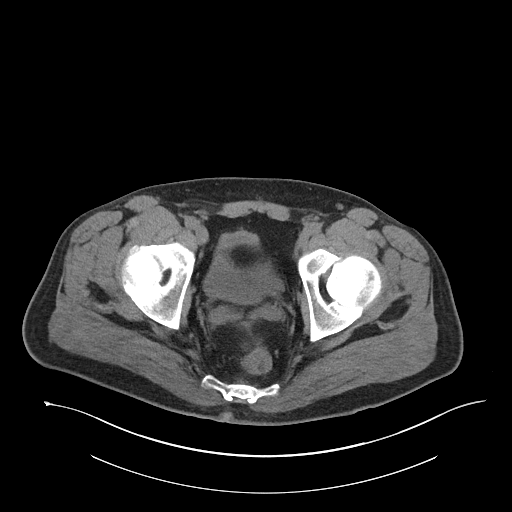
[im 37/109  soft-tissue]
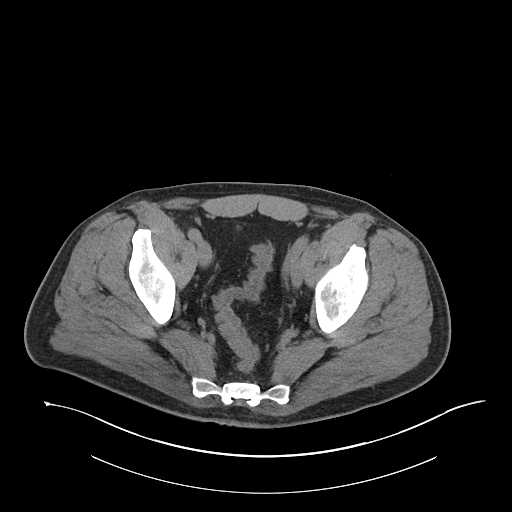
[im 46/109  soft-tissue]
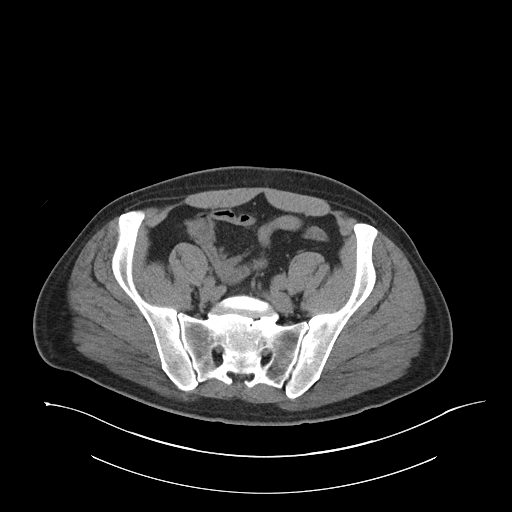
[im 55/109  soft-tissue]
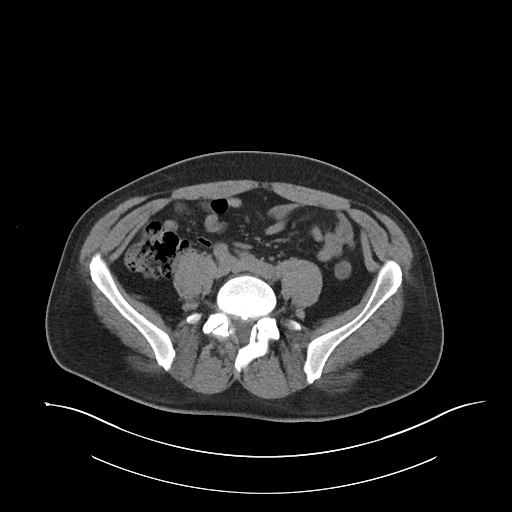
[im 64/109  soft-tissue]
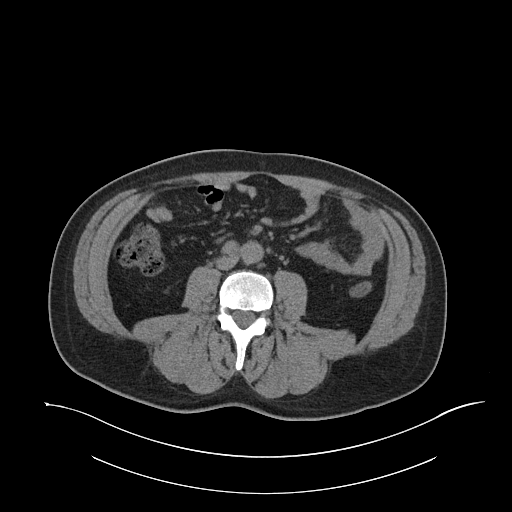
[im 73/109  soft-tissue]
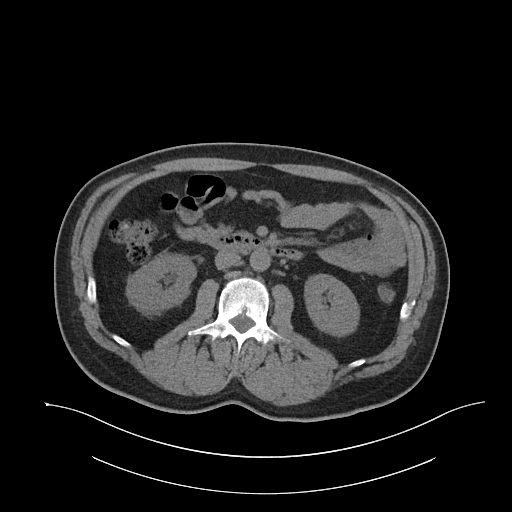
[im 73/109  bone]
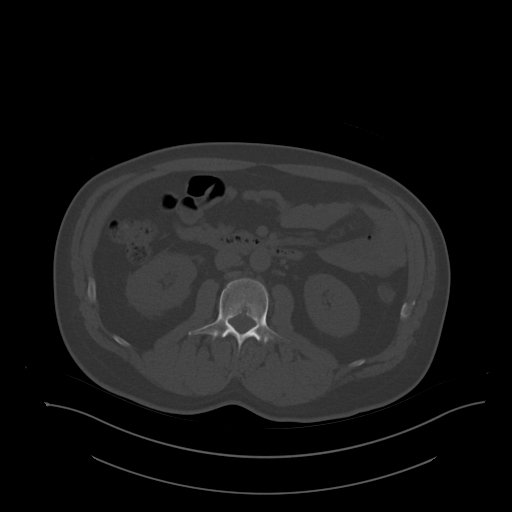
[im 77/109  soft-tissue]
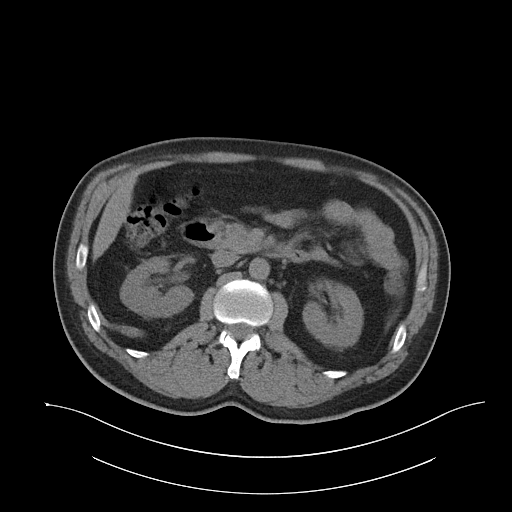
[im 86/109  soft-tissue]
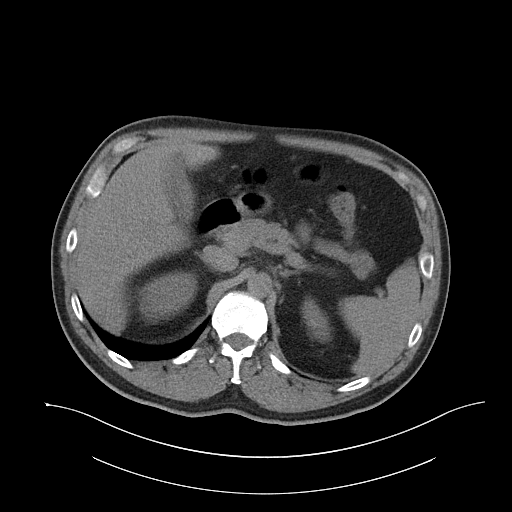
[im 95/109  soft-tissue]
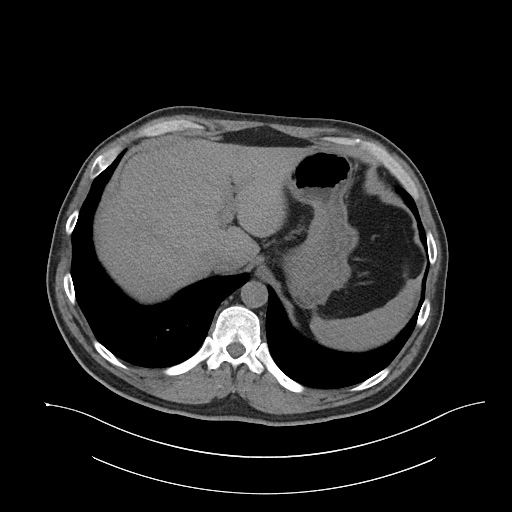
[im 104/109  soft-tissue]
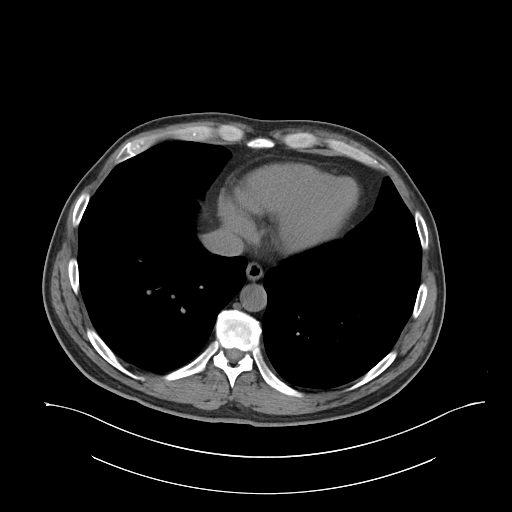

[Series 6: renal stone 3.0 cor · coronal · 1.02mm/px · 3 of 100 slices shown]
[im 34/100  soft-tissue]
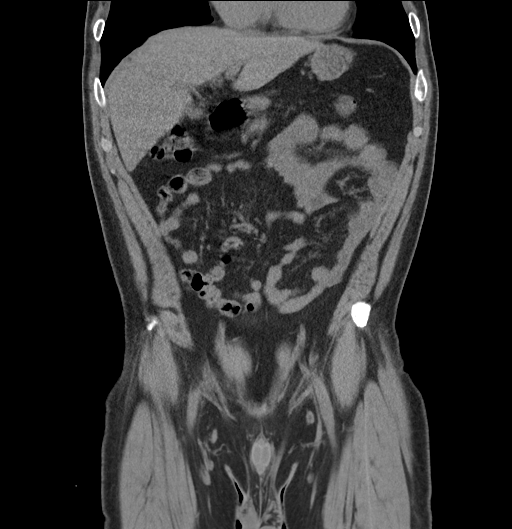
[im 45/100  soft-tissue]
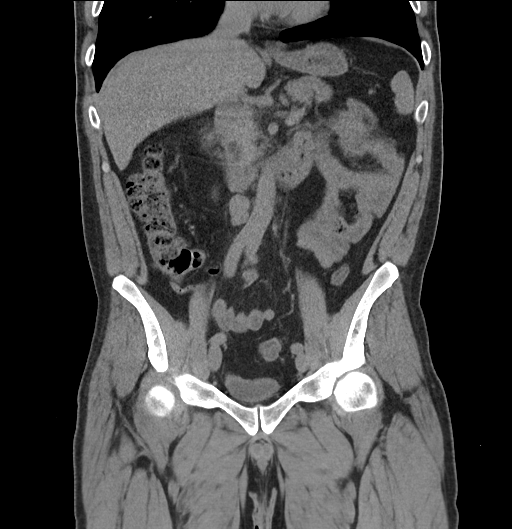
[im 56/100  soft-tissue]
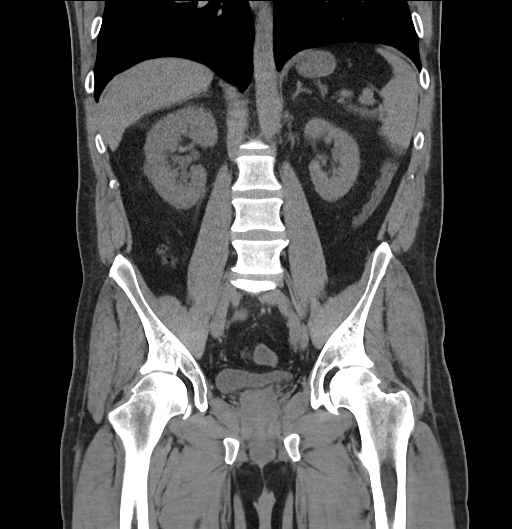

[16 of 46 positions shown; findings below may reference images not displayed]

FINDINGS: Lower chest: Lung bases are clear.

Hepatobiliary: There is mild fatty infiltration near the fissure for
the ligamentum teres. No focal liver lesions are evident on this
noncontrast enhanced study. Gallbladder wall does not appear
appreciably thickened. There is no biliary duct dilatation.

Pancreas: There is no pancreatic mass or inflammatory focus.

Spleen: No splenic lesions are evident.

Adrenals/Urinary Tract: Adrenals bilaterally appear normal. There is
no appreciable renal mass on either side. Right kidney is subtly
edematous. There is mild hydronephrosis on the right. There is no
hydronephrosis on the left. There is no evident intrarenal calculus
on either side. There is a 3 x 3 mm calculus in the right ureter at
the L4-5 level. No other ureteral calculi evident on either side.
Urinary bladder is midline with wall thickness within normal limits.

Stomach/Bowel: There is no appreciable bowel wall or mesenteric
thickening. There is no evident bowel obstruction. Terminal ileum
appears unremarkable. There is no evident free air or portal venous
air.

Vascular/Lymphatic: There is no abdominal aortic aneurysm. No
vascular lesions are evident on this noncontrast enhanced study. No
adenopathy is evident in the abdomen or pelvis.

Reproductive: Prostate and seminal vesicles are normal in size and
contour. There is no pelvic mass evident.

Other: Appendix appears normal. There is no evident abscess or
ascites in the abdomen or pelvis.

Musculoskeletal: There are pars defects at L5 bilaterally. There is
7 mm of anterolisthesis of L5 on S1. There is 2 mm of retrolisthesis
of L4 on L5. There is degenerative change in the lower lumbar
region. There are no blastic or lytic bone lesions. There is no
intramuscular or abdominal wall lesion.
IMPRESSION: 1. 3 x 3 mm calculus in the right ureter at the L4-5 level with mild
hydronephrosis on the right. There is mild renal edema on the right.

2. No evident bowel obstruction. No abscess in the abdomen or
pelvis. Appendix appears normal.

3. Pars defects at L5 bilaterally with spondylolisthesis at L5-S1
and to a lesser extent at L4-5.

## 2020-05-14 ENCOUNTER — Other Ambulatory Visit: Payer: BC Managed Care – PPO

## 2020-05-15 ENCOUNTER — Encounter: Payer: Self-pay | Admitting: Adult Health

## 2020-05-18 ENCOUNTER — Other Ambulatory Visit: Payer: BC Managed Care – PPO

## 2020-05-21 ENCOUNTER — Encounter: Payer: Self-pay | Admitting: Adult Health

## 2020-05-22 ENCOUNTER — Other Ambulatory Visit: Payer: Self-pay | Admitting: Adult Health

## 2020-05-22 MED ORDER — SERTRALINE HCL 100 MG PO TABS: 100.0000 mg | ORAL_TABLET | Freq: Every day | ORAL | 1 refills | Status: DC

## 2020-07-23 ENCOUNTER — Ambulatory Visit: Payer: Self-pay | Admitting: *Deleted

## 2020-07-23 NOTE — Telephone Encounter (Signed)
  Patient called to request appointment for covid testing today. Asked patient if he was having covid symptoms and patient responded yes but did not give specific symptoms when asked and reported he only is calling to set up covid testing because his family is requesting for him to be tested. Patient appt. Set up at [facility] 07/26/20.  Patient verbalized understanding of appt. And address [address] [place].    Reason for Disposition . COVID-19 Testing, questions about  Answer Assessment - Initial Assessment Questions 1. COVID-19 DIAGNOSIS: "Who made your COVID-19 diagnosis?" "Was it confirmed by a positive lab test?" If not diagnosed by a HCP, ask "Are there lots of cases (community spread) where you live?" Note: See public health department website, if unsure.     na 2. COVID-19 EXPOSURE: "Was there any known exposure to COVID before the symptoms began?" CDC Definition of close contact: within 6 feet (2 meters) for a total of 15 minutes or more over a 24-hour period.      na 3. ONSET: "When did the COVID-19 symptoms start?"      Patient did not give information when symptoms started  4. WORST SYMPTOM: "What is your worst symptom?" (e.g., cough, fever, shortness of breath, muscle aches)     na 5. COUGH: "Do you have a cough?" If Yes, ask: "How bad is the cough?"       na 6. FEVER: "Do you have a fever?" If Yes, ask: "What is your temperature, how was it measured, and when did it start?"     na 7. RESPIRATORY STATUS: "Describe your breathing?" (e.g., shortness of breath, wheezing, unable to speak)      na 8. BETTER-SAME-WORSE: "Are you getting better, staying the same or getting worse compared to yesterday?"  If getting worse, ask, "In what way?"     na 9. HIGH RISK DISEASE: "Do you have any chronic medical problems?" (e.g., asthma, heart or lung disease, weak immune system, obesity, etc.)     na 10. VACCINE: "Have you gotten the COVID-19 vaccine?" If Yes ask:  "Which one, how many shots, when did you get it?"       na 11. PREGNANCY: "Is there any chance you are pregnant?" "When was your last menstrual period?"       na 12. OTHER SYMPTOMS: "Do you have any other symptoms?"  (e.g., chills, fatigue, headache, loss of smell or taste, muscle pain, sore throat; new loss of smell or taste especially support the diagnosis of COVID-19)       Did not give symptoms, reports "I have some symptoms and want to be tested".  Protocols used: CORONAVIRUS (COVID-19) DIAGNOSED OR SUSPECTED-A-AH

## 2020-07-24 DIAGNOSIS — Z20822 Contact with and (suspected) exposure to covid-19: Secondary | ICD-10-CM | POA: Diagnosis not present

## 2020-07-26 ENCOUNTER — Other Ambulatory Visit: Payer: BC Managed Care – PPO

## 2020-10-03 ENCOUNTER — Other Ambulatory Visit: Payer: Self-pay

## 2020-10-03 MED ORDER — SERTRALINE HCL 100 MG PO TABS: 100.0000 mg | ORAL_TABLET | Freq: Every day | ORAL | 1 refills | Status: DC

## 2020-10-03 NOTE — Addendum Note (Signed)
Addended by: Kathreen Devoid on: 10/03/2020 04:37 PM   Modules accepted: Orders

## 2020-12-18 ENCOUNTER — Ambulatory Visit (INDEPENDENT_AMBULATORY_CARE_PROVIDER_SITE_OTHER): Payer: BC Managed Care – PPO | Admitting: Adult Health

## 2020-12-18 ENCOUNTER — Other Ambulatory Visit: Payer: Self-pay

## 2020-12-18 ENCOUNTER — Encounter: Payer: Self-pay | Admitting: Adult Health

## 2020-12-18 VITALS — BP 110/80 | HR 69 | Temp 98.1°F | Ht 73.75 in | Wt 265.0 lb

## 2020-12-18 DIAGNOSIS — Z0000 Encounter for general adult medical examination without abnormal findings: Secondary | ICD-10-CM

## 2020-12-18 DIAGNOSIS — Z1159 Encounter for screening for other viral diseases: Secondary | ICD-10-CM | POA: Diagnosis not present

## 2020-12-18 DIAGNOSIS — F419 Anxiety disorder, unspecified: Secondary | ICD-10-CM | POA: Diagnosis not present

## 2020-12-18 DIAGNOSIS — R5383 Other fatigue: Secondary | ICD-10-CM | POA: Diagnosis not present

## 2020-12-18 DIAGNOSIS — Z1211 Encounter for screening for malignant neoplasm of colon: Secondary | ICD-10-CM

## 2020-12-18 DIAGNOSIS — N529 Male erectile dysfunction, unspecified: Secondary | ICD-10-CM | POA: Diagnosis not present

## 2020-12-18 DIAGNOSIS — E782 Mixed hyperlipidemia: Secondary | ICD-10-CM | POA: Diagnosis not present

## 2020-12-18 DIAGNOSIS — E663 Overweight: Secondary | ICD-10-CM

## 2020-12-18 MED ORDER — TADALAFIL 20 MG PO TABS: ORAL_TABLET | ORAL | 3 refills | Status: DC

## 2020-12-18 NOTE — Progress Notes (Signed)
Subjective:    Patient ID: Edward Nash, male    DOB: 24-Feb-1976, 45 y.o.   MRN: [id number]  HPI Patient presents for yearly preventative medicine examination. He is a pleasant 45 year old male who  has a past medical history of Anxiety, Hyperlipidemia, and Migraines.   Hyperlipidemia - takes Crestor 20 mg QHS. Denies myalgia or fatigue   Lab Results  Component Value Date   CHOL 236 (H) 02/08/2020   HDL 66 02/08/2020   LDLCALC 147 (H) 02/08/2020   LDLDIRECT 145.6 08/19/2013   TRIG 115 02/08/2020   CHOLHDL 3.6 02/08/2020   Anxiety - controlled with Zoloft 100 mg daily   ED - uses Cialis 20 mg PRN   Concern for sleep apnea - his wife has noticed that he snores and wakes up gasping for breathing. He does feel as though he is more fatigued as he has been in the past.   All immunizations and health maintenance protocols were reviewed with the patient and needed orders were placed.  Appropriate screening laboratory values were ordered for the patient including screening of hyperlipidemia, renal function and hepatic function. If indicated by BPH, a PSA was ordered.  Medication reconciliation,  past medical history, social history, problem list and allergies were reviewed in detail with the patient  Goals were established with regard to weight loss, exercise, and  diet in compliance with medications. Is not doing much in the way of exercise and is snacking a lot on junk food. His weight is up.   Wt Readings from Last 3 Encounters:  12/18/20 265 lb (120.2 kg)  02/08/20 247 lb (112 kg)  04/22/18 254 lb 6.4 oz (115.4 kg)   Due for colon cancer screening    Review of Systems  Constitutional: Positive for fatigue.  HENT: Negative.   Eyes: Negative.   Respiratory: Negative.   Cardiovascular: Negative.   Gastrointestinal: Negative.   Endocrine: Negative.   Genitourinary: Negative.   Musculoskeletal: Negative.   Skin: Negative.   Allergic/Immunologic: Negative.    Neurological: Negative.   Hematological: Negative.   Psychiatric/Behavioral: Positive for sleep disturbance.  All other systems reviewed and are negative.  Past Medical History:  Diagnosis Date  . Anxiety   . Hyperlipidemia   . Migraines     Social History   Socioeconomic History  . Marital status: Married    Spouse name: Not on file  . Number of children: Not on file  . Years of education: Not on file  . Highest education level: Not on file  Occupational History  . Not on file  Tobacco Use  . Smoking status: Never Smoker  . Smokeless tobacco: Former Neurosurgeon    Types: Chew  Substance and Sexual Activity  . Alcohol use: Yes    Alcohol/week: 1.0 standard drink    Types: 1 Cans of beer per week  . Drug use: No  . Sexual activity: Not on file  Other Topics Concern  . Not on file  Social History Narrative  . Not on file   Social Determinants of Health   Financial Resource Strain: Not on file  Food Insecurity: Not on file  Transportation Needs: Not on file  Physical Activity: Not on file  Stress: Not on file  Social Connections: Not on file  Intimate Partner Violence: Not on file    Past Surgical History:  Procedure Laterality Date  . CARDIAC CATHETERIZATION  2009    History reviewed. No pertinent family history.  Allergies  Allergen  Reactions  . Sulfa Antibiotics Rash    Current Outpatient Medications on File Prior to Visit  Medication Sig Dispense Refill  . rosuvastatin (CRESTOR) 20 MG tablet Take 1 tablet (20 mg total) by mouth at bedtime. 90 tablet 3  . sertraline (ZOLOFT) 100 MG tablet Take 1 tablet (100 mg total) by mouth daily. 90 tablet 1  . tadalafil (CIALIS) 20 MG tablet Take 1 tablet by mouth as needed.  **DUE FOR YEARLY PHYSICAL** 5 tablet 0   No current facility-administered medications on file prior to visit.    BP 110/80   Pulse 69   Temp 98.1 F (36.7 C) (Oral)   Ht 6' 1.75" (1.873 m)   Wt 265 lb (120.2 kg)   SpO2 97%   BMI 34.26  kg/m       Objective:   Physical Exam Vitals and nursing note reviewed.  Constitutional:      General: He is not in acute distress.    Appearance: Normal appearance. He is well-developed. He is obese.  HENT:     Head: Normocephalic and atraumatic.     Right Ear: Tympanic membrane, ear canal and external ear normal. There is no impacted cerumen.     Left Ear: Tympanic membrane, ear canal and external ear normal. There is no impacted cerumen.     Nose: Nose normal. No congestion or rhinorrhea.     Mouth/Throat:     Mouth: Mucous membranes are moist.     Pharynx: Oropharynx is clear. No oropharyngeal exudate or posterior oropharyngeal erythema.  Eyes:     General:        Right eye: No discharge.        Left eye: No discharge.     Extraocular Movements: Extraocular movements intact.     Conjunctiva/sclera: Conjunctivae normal.     Pupils: Pupils are equal, round, and reactive to light.  Neck:     Vascular: No carotid bruit.     Trachea: No tracheal deviation.  Cardiovascular:     Rate and Rhythm: Normal rate and regular rhythm.     Pulses: Normal pulses.     Heart sounds: Normal heart sounds. No murmur heard. No friction rub. No gallop.   Pulmonary:     Effort: Pulmonary effort is normal. No respiratory distress.     Breath sounds: Normal breath sounds. No stridor. No wheezing, rhonchi or rales.  Chest:     Chest wall: No tenderness.  Abdominal:     General: Bowel sounds are normal. There is no distension.     Palpations: Abdomen is soft. There is no mass.     Tenderness: There is no abdominal tenderness. There is no right CVA tenderness, left CVA tenderness, guarding or rebound.     Hernia: No hernia is present.  Musculoskeletal:        General: No swelling, tenderness, deformity or signs of injury. Normal range of motion.     Right lower leg: No edema.     Left lower leg: No edema.  Lymphadenopathy:     Cervical: No cervical adenopathy.  Skin:    General: Skin is warm  and dry.     Capillary Refill: Capillary refill takes less than 2 seconds.     Coloration: Skin is not jaundiced or pale.     Findings: No bruising, erythema, lesion or rash.  Neurological:     General: No focal deficit present.     Mental Status: He is alert and oriented to person, place,  and time.     Cranial Nerves: No cranial nerve deficit.     Sensory: No sensory deficit.     Motor: No weakness.     Coordination: Coordination normal.     Gait: Gait normal.     Deep Tendon Reflexes: Reflexes normal.  Psychiatric:        Mood and Affect: Mood normal.        Behavior: Behavior normal.        Thought Content: Thought content normal.        Judgment: Judgment normal.       Assessment & Plan:  1. Routine general medical examination at a health care facility - Needs to start working on weight loss through diet and exercise  - Follow up in one year or sooner if needed - CBC with Differential/Platelet; Future - Comprehensive metabolic panel; Future - Hemoglobin A1c; Future - Lipid panel; Future - TSH; Future  2. Mixed hyperlipidemia - Consider increase in statin  - CBC with Differential/Platelet; Future - Comprehensive metabolic panel; Future - Hemoglobin A1c; Future - Lipid panel; Future - TSH; Future  3. Anxiety - Continue with Zoloft   4. Colon cancer screening  - Ambulatory referral to Gastroenterology  5. Need for hepatitis C screening test  - Hep C Antibody; Future  6. Erectile dysfunction, unspecified erectile dysfunction type  - tadalafil (CIALIS) 20 MG tablet; Take 1 tablet by mouth as needed.  Dispense: 20 tablet; Refill: 3  7. Other fatigue - likely sleep apnea from weight gain  - CBC with Differential/Platelet; Future - Comprehensive metabolic panel; Future - Hemoglobin A1c; Future - Lipid panel; Future - TSH; Future - Ambulatory referral to Pulmonology  8. Overweight - needs to start working in weight loss

## 2020-12-18 NOTE — Patient Instructions (Signed)
It was great seeing you today   You can go to the [facility] lab for your blood work   Someone will call you to schedule your colonoscopy and sleep study   Please work on lifestyle modifications to help with weight loss

## 2021-02-15 ENCOUNTER — Institutional Professional Consult (permissible substitution): Payer: BC Managed Care – PPO | Admitting: Internal Medicine

## 2021-03-06 ENCOUNTER — Other Ambulatory Visit: Payer: Self-pay | Admitting: Adult Health

## 2021-03-17 ENCOUNTER — Other Ambulatory Visit: Payer: Self-pay | Admitting: Adult Health

## 2021-03-19 ENCOUNTER — Encounter: Payer: Self-pay | Admitting: Adult Health

## 2021-07-04 ENCOUNTER — Other Ambulatory Visit: Payer: Self-pay | Admitting: Adult Health

## 2021-07-04 ENCOUNTER — Encounter: Payer: Self-pay | Admitting: Adult Health

## 2021-07-04 DIAGNOSIS — E782 Mixed hyperlipidemia: Secondary | ICD-10-CM

## 2021-07-09 ENCOUNTER — Ambulatory Visit: Payer: BC Managed Care – PPO | Admitting: Adult Health

## 2021-07-12 ENCOUNTER — Encounter: Payer: Self-pay | Admitting: Adult Health

## 2021-07-12 ENCOUNTER — Ambulatory Visit: Payer: BC Managed Care – PPO | Admitting: Adult Health

## 2021-07-12 VITALS — BP 112/80 | HR 73 | Temp 98.3°F | Ht 73.75 in | Wt 271.0 lb

## 2021-07-12 DIAGNOSIS — E782 Mixed hyperlipidemia: Secondary | ICD-10-CM | POA: Diagnosis not present

## 2021-07-12 DIAGNOSIS — R6882 Decreased libido: Secondary | ICD-10-CM | POA: Diagnosis not present

## 2021-07-12 DIAGNOSIS — N529 Male erectile dysfunction, unspecified: Secondary | ICD-10-CM

## 2021-07-12 LAB — LIPID PANEL
Cholesterol: 148 mg/dL (ref 0–200)
HDL: 55.8 mg/dL (ref >39.00)
LDL Cholesterol: 67 mg/dL (ref 0–99)
NonHDL: 92.26
Total CHOL/HDL Ratio: 3
Triglycerides: 128 mg/dL (ref 0.0–149.0)
VLDL: 25.6 mg/dL (ref 0.0–40.0)

## 2021-07-12 LAB — TESTOSTERONE: Testosterone: 262.29 ng/dL — ABNORMAL LOW (ref 300.00–890.00)

## 2021-07-12 MED ORDER — TADALAFIL 20 MG PO TABS: 20.0000 mg | ORAL_TABLET | ORAL | 3 refills | Status: DC | PRN

## 2021-07-12 NOTE — Progress Notes (Signed)
Subjective:    Patient ID: Edward Nash, male    DOB: [birth year], 45 y.o.   MRN: [id number]  HPI  45 year old male who  has a past medical history of Anxiety, Hyperlipidemia, and Migraines.  He presents to the office today for follow-up regarding hyperlipidemia.  He is currently managed with Crestor 20 mg daily and has been on Crestor for about 6 months. He would like to recheck his levels   Lab Results  Component Value Date   CHOL 236 (H) 02/08/2020   HDL 66 02/08/2020   LDLCALC 147 (H) 02/08/2020   LDLDIRECT 145.6 08/19/2013   TRIG 115 02/08/2020   CHOLHDL 3.6 02/08/2020   Additionally, he would like to have his testosterone checked. Reports fatigue and decreased sex drive.   Needs his Cialis refilled as well   Review of Systems See HPI   Past Medical History:  Diagnosis Date   Anxiety    Hyperlipidemia    Migraines     Social History   Socioeconomic History   Marital status: Married    Spouse name: Not on file   Number of children: Not on file   Years of education: Not on file   Highest education level: Master's degree (e.g., MA, MS, MEng, MEd, MSW, MBA)  Occupational History   Not on file  Tobacco Use   Smoking status: Never   Smokeless tobacco: Former    Types: Chew  Substance and Sexual Activity   Alcohol use: Yes    Alcohol/week: 1.0 standard drink    Types: 1 Cans of beer per week   Drug use: No   Sexual activity: Not on file  Other Topics Concern   Not on file  Social History Narrative   Not on file   Social Determinants of Health   Financial Resource Strain: Low Risk    Difficulty of Paying Living Expenses: Not hard at all  Food Insecurity: No Food Insecurity   Worried About Programme researcher, broadcasting/film/video in the Last Year: Never true   Ran Out of Food in the Last Year: Never true  Transportation Needs: No Transportation Needs   Lack of Transportation (Medical): No   Lack of Transportation (Non-Medical): No  Physical Activity: Insufficiently  Active   Days of Exercise per Week: 1 day   Minutes of Exercise per Session: 20 min  Stress: No Stress Concern Present   Feeling of Stress : Only a little  Social Connections: Unknown   Frequency of Communication with Friends and Family: Three times a week   Frequency of Social Gatherings with Friends and Family: Once a week   Attends Religious Services: Patient refused   Active Member of Clubs or Organizations: No   Attends Engineer, structural: Not on file   Marital Status: Married  Catering manager Violence: Not on file    Past Surgical History:  Procedure Laterality Date   CARDIAC CATHETERIZATION  2009    No family history on file.  Allergies  Allergen Reactions   Sulfa Antibiotics Rash    Current Outpatient Medications on File Prior to Visit  Medication Sig Dispense Refill   rosuvastatin (CRESTOR) 20 MG tablet TAKE 1 TABLET BY MOUTH EVERYDAY AT BEDTIME 90 tablet 3   sertraline (ZOLOFT) 100 MG tablet TAKE 1 TABLET BY MOUTH EVERY DAY 90 tablet 1   No current facility-administered medications on file prior to visit.    BP 112/80    Pulse 73    Temp 98.3  F (36.8 C) (Oral)    Ht 6' 1.75" (1.873 m)    Wt 271 lb (122.9 kg)    SpO2 98%    BMI 35.03 kg/m       Objective:   Physical Exam Vitals and nursing note reviewed.  Constitutional:      Appearance: Normal appearance.  Cardiovascular:     Rate and Rhythm: Normal rate and regular rhythm.     Pulses: Normal pulses.     Heart sounds: Normal heart sounds.  Pulmonary:     Effort: Pulmonary effort is normal.     Breath sounds: Normal breath sounds.  Musculoskeletal:        General: Normal range of motion.  Skin:    General: Skin is warm and dry.  Neurological:     General: No focal deficit present.     Mental Status: He is alert and oriented to person, place, and time.  Psychiatric:        Mood and Affect: Mood normal.        Behavior: Behavior normal.        Thought Content: Thought content normal.         Judgment: Judgment normal.      Assessment & Plan:  1. Mixed hyperlipidemia - Consider dose change of crestor  - Lipid panel; Future  2. Low libido  - Testosterone; Future  3. Erectile dysfunction, unspecified erectile dysfunction type  - tadalafil (CIALIS) 20 MG tablet; Take 1 tablet (20 mg total) by mouth every other day as needed for erectile dysfunction.  Dispense: 20 tablet; Refill: 3  Shirline Frees, NP

## 2021-07-16 ENCOUNTER — Other Ambulatory Visit: Payer: Self-pay | Admitting: Adult Health

## 2021-07-16 DIAGNOSIS — R7989 Other specified abnormal findings of blood chemistry: Secondary | ICD-10-CM

## 2021-07-16 MED ORDER — DICLOFENAC POTASSIUM(MIGRAINE) 50 MG PO PACK: PACK | ORAL | 6 refills | Status: DC

## 2021-07-23 ENCOUNTER — Other Ambulatory Visit: Payer: Self-pay | Admitting: Adult Health

## 2021-07-23 ENCOUNTER — Encounter: Payer: Self-pay | Admitting: Adult Health

## 2021-08-13 ENCOUNTER — Encounter: Payer: Self-pay | Admitting: Adult Health

## 2021-08-14 ENCOUNTER — Ambulatory Visit: Payer: BC Managed Care – PPO | Admitting: Adult Health

## 2021-08-14 ENCOUNTER — Telehealth: Payer: Self-pay | Admitting: Adult Health

## 2021-08-14 ENCOUNTER — Other Ambulatory Visit: Payer: Self-pay

## 2021-08-14 ENCOUNTER — Telehealth: Payer: BC Managed Care – PPO | Admitting: Family Medicine

## 2021-08-14 DIAGNOSIS — J029 Acute pharyngitis, unspecified: Secondary | ICD-10-CM | POA: Diagnosis not present

## 2021-08-14 LAB — POCT RAPID STREP A (OFFICE): Rapid Strep A Screen: NEGATIVE

## 2021-08-14 NOTE — Telephone Encounter (Signed)
Pt has an appt scheduled on 08/14/21 @4  pm with Bruce Brurchette.  Patient Name: [name] Gender: Male DOB: 03-09-1976 Age: 46 Y 4 M 13 D Return Phone Number: [phone] (Primary) Address: City/ State/ Zip: [place]  [place] [facility] [place] [facility] [address] [facility] Human resources officer Type Call Who Is Calling Patient / Member / Family / Caregiver Call Type Triage / Clinical Relationship To Patient Self Return Phone Number [phone] (Primary) Chief Complaint Sore Throat Reason for Call Symptomatic / Request for Health Information Initial Comment Caller states he is not feeling well. His son has Mono. Symptoms include sore thrat, body aches Translation No Disp. Time [phone] Time) Disposition Final User 08/14/2021 9:17:26 AM Send To Nurse 08/16/2021, RN, Kaila 08/14/2021 9:26:35 AM Attempt made - message left [place], RN, 08/16/2021 08/14/2021 9:40:08 AM FINAL ATTEMPT MADE - no message left [place], RN, 08/16/2021 08/14/2021 9:40:15 AM Send to RN Final Attempt 08/16/2021, RN, Heather 08/14/2021 11:03:11 AM Attempt made - message left 08/16/2021, RN, Windy 08/14/2021 11:11:45 AM FINAL ATTEMPT MADE - no message left Yes 08/16/2021, RN, [place]

## 2021-08-14 NOTE — Telephone Encounter (Signed)
I called pt to schedule but no answer. Scheduled pt for 300 called again to make sure pt was aware of appt. And if that time was appropriate for pt but still no answer. Pt returned call and front office staff advised pt to come in 45 min early before appt. Pt declined and stated that they could not come in 45 min before appt. Pt then wanted an earlier appt. We had a 9:30 am appt that was opened that was offered but then pt stated that they just wanted to be tested they dont want an appt. Front office staff advised that pt will need an appt. Pt was no longer on the schedule.

## 2021-08-14 NOTE — Telephone Encounter (Signed)
Patient calling in with respiratory symptoms: Shortness of breath, chest pain, palpitations or other red words send to Triage  Does the patient have a fever over 100, cough, congestion, sore throat, runny nose, lost of taste/smell (please list symptoms that patient has)?mono like symptoms  What date did symptoms start? 01/17 (If over 5 days ago, pt may be scheduled for in person visit)  Have you tested for Covid in the last 5 days? No   If yes, was it positive OR negative ? If positive in the last 5 days, please schedule virtual visit now. If negative, schedule for an in person OV with the next available provider if PCP has no openings. Please also let patient know they will be tested again (follow the script below)  "you will have to arrive 62mins prior to your appt time to be Covid tested. Please park in back of office at the [facility] & call [phone] to let the staff know you have arrived. A staff member will meet you at your car to do a rapid covid test. Once the test has resulted you will be notified by phone of your results to determine if appt will remain an in person visit or be converted to a virtual/phone visit. If you arrive less than 19mins before your appt time, your visit will be automatically converted to virtual & any recommended testing will happen AFTER the visit."   [place]  If no availability for virtual visit in office,  please schedule another [place] office  If no availability at another [place] office, please instruct patient that they can schedule an evisit or virtual visit through their mychart account. Visits up to 8pm  patients can be seen in office 5 days after positive COVID test

## 2021-08-14 NOTE — Progress Notes (Signed)
Patient ID: Edward Nash, male   DOB: 1976/07/24, 46 y.o.   MRN: [id number]   This visit type was conducted due to national recommendations for restrictions regarding the COVID-19 pandemic in an effort to limit this patient's exposure and mitigate transmission in our community.   Virtual Visit via Video Note  I connected with Edward Nash on 08/14/21 at  4:00 PM EST by a video enabled telemedicine application and verified that I am speaking with the correct person using two identifiers.  Location patient: home Location provider:work or home office Persons participating in the virtual visit: patient, provider  I discussed the limitations of evaluation and management by telemedicine and the availability of in person appointments. The patient expressed understanding and agreed to proceed.   HPI:  Patient called with 2-day history of sore throat and tender lymph nodes anterior neck.  He had some body aches.  Temperature 99.8.  Some increased malaise.  Has not done home COVID testing yet.  57 year old son was diagnosed January 8 with mono.  His 63 year old son also recently had COVID.  Patient denies any dyspnea.  No nausea or vomiting.  He is wanting to rule out strep.  He does have home COVID test which he plans to do later.   ROS: See pertinent positives and negatives per HPI.  Past Medical History:  Diagnosis Date   Anxiety    Hyperlipidemia    Migraines     Past Surgical History:  Procedure Laterality Date   CARDIAC CATHETERIZATION  2009    No family history on file.  SOCIAL HX: Non-smoker   Current Outpatient Medications:    Diclofenac Potassium,Migraine, 50 MG PACK, Take 50 mg by mouth daily PRN, Disp: 4 each, Rfl: 6   rosuvastatin (CRESTOR) 20 MG tablet, TAKE 1 TABLET BY MOUTH EVERYDAY AT BEDTIME, Disp: 90 tablet, Rfl: 3   sertraline (ZOLOFT) 100 MG tablet, TAKE 1 TABLET BY MOUTH EVERY DAY, Disp: 90 tablet, Rfl: 1   tadalafil (CIALIS) 20 MG tablet, Take 1 tablet  (20 mg total) by mouth every other day as needed for erectile dysfunction., Disp: 20 tablet, Rfl: 3  EXAM:  VITALS per patient if applicable:  GENERAL: alert, oriented, appears well and in no acute distress  HEENT: atraumatic, conjunttiva clear, no obvious abnormalities on inspection of external nose and ears  NECK: normal movements of the head and neck  LUNGS: on inspection no signs of respiratory distress, breathing rate appears normal, no obvious gross SOB, gasping or wheezing  CV: no obvious cyanosis  MS: moves all visible extremities without noticeable abnormality  PSYCH/NEURO: pleasant and cooperative, no obvious depression or anxiety, speech and thought processing grossly intact  ASSESSMENT AND PLAN:  Discussed the following assessment and plan:  Sore throat - Plan: POCT rapid strep A  -Rapid strep obtained and NEGATIVE -He would like to rule out mono.  Our lab was already closed.  We did write prescription for him to get Monospot and he is considering trying to get this at either [facility] or [facility] lab if possible -We also encouraged him to go ahead and do home COVID test. -Likely viral origin.  Treat symptomatically with Motrin up to 800 mg every 8 hours as needed -Follow-up for persistent or worsening symptoms     I discussed the assessment and treatment plan with the patient. The patient was provided an opportunity to ask questions and all were answered. The patient agreed with the plan and demonstrated an understanding of the instructions.  The patient was advised to call back or seek an in-person evaluation if the symptoms worsen or if the condition fails to improve as anticipated.     Carolann Littler, MD

## 2021-08-15 DIAGNOSIS — J029 Acute pharyngitis, unspecified: Secondary | ICD-10-CM | POA: Diagnosis not present

## 2021-08-18 ENCOUNTER — Encounter: Payer: Self-pay | Admitting: Family Medicine

## 2021-09-11 DIAGNOSIS — L570 Actinic keratosis: Secondary | ICD-10-CM | POA: Diagnosis not present

## 2021-09-11 DIAGNOSIS — L439 Lichen planus, unspecified: Secondary | ICD-10-CM | POA: Diagnosis not present

## 2021-09-11 DIAGNOSIS — L309 Dermatitis, unspecified: Secondary | ICD-10-CM | POA: Diagnosis not present

## 2021-09-11 DIAGNOSIS — L821 Other seborrheic keratosis: Secondary | ICD-10-CM | POA: Diagnosis not present

## 2021-09-11 DIAGNOSIS — L820 Inflamed seborrheic keratosis: Secondary | ICD-10-CM | POA: Diagnosis not present

## 2021-09-11 DIAGNOSIS — B353 Tinea pedis: Secondary | ICD-10-CM | POA: Diagnosis not present

## 2021-09-11 DIAGNOSIS — L814 Other melanin hyperpigmentation: Secondary | ICD-10-CM | POA: Diagnosis not present

## 2021-09-11 DIAGNOSIS — D225 Melanocytic nevi of trunk: Secondary | ICD-10-CM | POA: Diagnosis not present

## 2021-09-11 DIAGNOSIS — D485 Neoplasm of uncertain behavior of skin: Secondary | ICD-10-CM | POA: Diagnosis not present

## 2021-09-11 DIAGNOSIS — L538 Other specified erythematous conditions: Secondary | ICD-10-CM | POA: Diagnosis not present

## 2021-09-18 DIAGNOSIS — H01002 Unspecified blepharitis right lower eyelid: Secondary | ICD-10-CM | POA: Diagnosis not present

## 2022-02-26 ENCOUNTER — Other Ambulatory Visit: Payer: Self-pay | Admitting: Adult Health

## 2022-02-26 NOTE — Telephone Encounter (Signed)
Pt due for CPE. Rx refilled for 30 days.

## 2022-03-24 ENCOUNTER — Other Ambulatory Visit: Payer: Self-pay | Admitting: Adult Health

## 2022-03-25 ENCOUNTER — Other Ambulatory Visit: Payer: Self-pay | Admitting: Adult Health

## 2022-04-01 DIAGNOSIS — L918 Other hypertrophic disorders of the skin: Secondary | ICD-10-CM | POA: Diagnosis not present

## 2022-04-01 DIAGNOSIS — R208 Other disturbances of skin sensation: Secondary | ICD-10-CM | POA: Diagnosis not present

## 2022-04-01 DIAGNOSIS — L298 Other pruritus: Secondary | ICD-10-CM | POA: Diagnosis not present

## 2022-04-01 DIAGNOSIS — D2372 Other benign neoplasm of skin of left lower limb, including hip: Secondary | ICD-10-CM | POA: Diagnosis not present

## 2022-04-01 DIAGNOSIS — Z789 Other specified health status: Secondary | ICD-10-CM | POA: Diagnosis not present

## 2022-04-01 DIAGNOSIS — L538 Other specified erythematous conditions: Secondary | ICD-10-CM | POA: Diagnosis not present

## 2022-04-06 ENCOUNTER — Other Ambulatory Visit: Payer: Self-pay | Admitting: Adult Health

## 2022-04-08 NOTE — Telephone Encounter (Signed)
Pt needs cpe 

## 2022-04-29 ENCOUNTER — Other Ambulatory Visit: Payer: Self-pay | Admitting: Adult Health

## 2022-05-05 ENCOUNTER — Telehealth: Payer: Self-pay | Admitting: Adult Health

## 2022-05-05 NOTE — Telephone Encounter (Signed)
Refill sertraline (ZOLOFT) 100 MG tablet, rosuvastatin (CRESTOR) 20 MG tablet   [id number] - [place], [place] - [address] Phone:  [phone]  Fax:  [phone]

## 2022-05-06 MED ORDER — SERTRALINE HCL 100 MG PO TABS: 100.0000 mg | ORAL_TABLET | Freq: Every day | ORAL | 0 refills | Status: DC

## 2022-05-06 MED ORDER — ROSUVASTATIN CALCIUM 20 MG PO TABS: ORAL_TABLET | ORAL | 0 refills | Status: DC

## 2022-05-06 NOTE — Telephone Encounter (Signed)
Limited refills sent to pharmacy.

## 2022-05-21 ENCOUNTER — Encounter: Payer: Self-pay | Admitting: Adult Health

## 2022-05-21 ENCOUNTER — Ambulatory Visit (INDEPENDENT_AMBULATORY_CARE_PROVIDER_SITE_OTHER): Payer: BC Managed Care – PPO | Admitting: Adult Health

## 2022-05-21 VITALS — BP 110/82 | Temp 98.2°F | Ht 74.0 in | Wt 260.0 lb

## 2022-05-21 DIAGNOSIS — Z0000 Encounter for general adult medical examination without abnormal findings: Secondary | ICD-10-CM | POA: Diagnosis not present

## 2022-05-21 DIAGNOSIS — F419 Anxiety disorder, unspecified: Secondary | ICD-10-CM | POA: Diagnosis not present

## 2022-05-21 DIAGNOSIS — Z1211 Encounter for screening for malignant neoplasm of colon: Secondary | ICD-10-CM

## 2022-05-21 DIAGNOSIS — Z125 Encounter for screening for malignant neoplasm of prostate: Secondary | ICD-10-CM | POA: Diagnosis not present

## 2022-05-21 DIAGNOSIS — G43109 Migraine with aura, not intractable, without status migrainosus: Secondary | ICD-10-CM

## 2022-05-21 DIAGNOSIS — N529 Male erectile dysfunction, unspecified: Secondary | ICD-10-CM | POA: Diagnosis not present

## 2022-05-21 DIAGNOSIS — E782 Mixed hyperlipidemia: Secondary | ICD-10-CM

## 2022-05-21 DIAGNOSIS — E663 Overweight: Secondary | ICD-10-CM

## 2022-05-21 LAB — LIPID PANEL
Cholesterol: 156 mg/dL (ref 0–200)
HDL: 56.9 mg/dL (ref >39.00)
LDL Cholesterol: 81 mg/dL (ref 0–99)
NonHDL: 98.96
Total CHOL/HDL Ratio: 3
Triglycerides: 92 mg/dL (ref 0.0–149.0)
VLDL: 18.4 mg/dL (ref 0.0–40.0)

## 2022-05-21 LAB — CBC WITH DIFFERENTIAL/PLATELET
Basophils Absolute: 0 10*3/uL (ref 0.0–0.1)
Basophils Relative: 0.6 % (ref 0.0–3.0)
Eosinophils Absolute: 0.1 10*3/uL (ref 0.0–0.7)
Eosinophils Relative: 1.5 % (ref 0.0–5.0)
HCT: 43.8 % (ref 39.0–52.0)
Hemoglobin: 14.7 g/dL (ref 13.0–17.0)
Lymphocytes Relative: 25.2 % (ref 12.0–46.0)
Lymphs Abs: 2.1 10*3/uL (ref 0.7–4.0)
MCHC: 33.7 g/dL (ref 30.0–36.0)
MCV: 88.1 fl (ref 78.0–100.0)
Monocytes Absolute: 0.6 10*3/uL (ref 0.1–1.0)
Monocytes Relative: 7.6 % (ref 3.0–12.0)
Neutro Abs: 5.5 10*3/uL (ref 1.4–7.7)
Neutrophils Relative %: 65.1 % (ref 43.0–77.0)
Platelets: 243 10*3/uL (ref 150.0–400.0)
RBC: 4.97 Mil/uL (ref 4.22–5.81)
RDW: 13 % (ref 11.5–15.5)
WBC: 8.4 10*3/uL (ref 4.0–10.5)

## 2022-05-21 LAB — COMPREHENSIVE METABOLIC PANEL
ALT: 24 U/L (ref 0–53)
AST: 17 U/L (ref 0–37)
Albumin: 4.7 g/dL (ref 3.5–5.2)
Alkaline Phosphatase: 51 U/L (ref 39–117)
BUN: 18 mg/dL (ref 6–23)
CO2: 30 mEq/L (ref 19–32)
Calcium: 9.9 mg/dL (ref 8.4–10.5)
Chloride: 103 mEq/L (ref 96–112)
Creatinine, Ser: 0.83 mg/dL (ref 0.40–1.50)
GFR: 103.77 mL/min (ref >60.00)
Glucose, Bld: 86 mg/dL (ref 70–99)
Potassium: 4 mEq/L (ref 3.5–5.1)
Sodium: 141 mEq/L (ref 135–145)
Total Bilirubin: 0.7 mg/dL (ref 0.2–1.2)
Total Protein: 7.2 g/dL (ref 6.0–8.3)

## 2022-05-21 LAB — TSH: TSH: 2.78 u[IU]/mL (ref 0.35–5.50)

## 2022-05-21 LAB — PSA: PSA: 0.37 ng/mL (ref 0.10–4.00)

## 2022-05-21 LAB — HEMOGLOBIN A1C: Hgb A1c MFr Bld: 5.9 % (ref 4.6–6.5)

## 2022-05-21 MED ORDER — DICLOFENAC POTASSIUM(MIGRAINE) 50 MG PO PACK: PACK | ORAL | 6 refills | Status: DC

## 2022-05-21 MED ORDER — SERTRALINE HCL 100 MG PO TABS: 100.0000 mg | ORAL_TABLET | Freq: Every day | ORAL | 1 refills | Status: DC

## 2022-05-21 MED ORDER — TADALAFIL 20 MG PO TABS: 20.0000 mg | ORAL_TABLET | ORAL | 3 refills | Status: DC | PRN

## 2022-05-21 MED ORDER — ROSUVASTATIN CALCIUM 20 MG PO TABS: ORAL_TABLET | ORAL | 3 refills | Status: DC

## 2022-05-21 NOTE — Progress Notes (Signed)
Subjective:    Patient ID: Edward Nash, male    DOB: 09-16-1975, 46 y.o.   MRN: [id number]  HPI Patient presents for yearly preventative medicine examination. He is a pleasant 46 year old male who  has a past medical history of Anxiety, Hyperlipidemia, and Migraines.  Hyperlipidemia -managed with Crestor 20 mg nightly.  He denies myalgia or fatigue Lab Results  Component Value Date   CHOL 148 07/12/2021   HDL 55.80 07/12/2021   LDLCALC 67 07/12/2021   LDLDIRECT 145.6 08/19/2013   TRIG 128.0 07/12/2021   CHOLHDL 3 07/12/2021   Anxiety-controlled with Zoloft 100 mg daily  Erectile dysfunction-uses Cialis 20 mg as needed  Migraine Headaches - uses Voltaren as needed  All immunizations and health maintenance protocols were reviewed with the patient and needed orders were placed.  Appropriate screening laboratory values were ordered for the patient including screening of hyperlipidemia, renal function and hepatic function. If indicated by BPH, a PSA was ordered.  Medication reconciliation,  past medical history, social history, problem list and allergies were reviewed in detail with the patient  Goals were established with regard to weight loss, exercise, and  diet in compliance with medications. He has been working on weight loss, he is walking his dog for 30 minutes a day. Diet continues to be inconsistent.   Wt Readings from Last 3 Encounters:  05/21/22 260 lb (117.9 kg)  07/12/21 271 lb (122.9 kg)  12/18/20 265 lb (120.2 kg)   He is due for colon cancer screening   Review of Systems See HPI   Past Medical History:  Diagnosis Date   Anxiety    Hyperlipidemia    Migraines     Social History   Socioeconomic History   Marital status: Married    Spouse name: Not on file   Number of children: Not on file   Years of education: Not on file   Highest education level: Master's degree (e.g., MA, MS, MEng, MEd, MSW, MBA)  Occupational History   Not on file   Tobacco Use   Smoking status: Never   Smokeless tobacco: Former    Types: Chew  Substance and Sexual Activity   Alcohol use: Yes    Alcohol/week: 1.0 standard drink of alcohol    Types: 1 Cans of beer per week   Drug use: No   Sexual activity: Not on file  Other Topics Concern   Not on file  Social History Narrative   Not on file   Social Determinants of Health   Financial Resource Strain: Low Risk  (07/11/2021)   Overall Financial Resource Strain (CARDIA)    Difficulty of Paying Living Expenses: Not hard at all  Food Insecurity: No Food Insecurity (07/11/2021)   Hunger Vital Sign    Worried About Running Out of Food in the Last Year: Never true    Ran Out of Food in the Last Year: Never true  Transportation Needs: No Transportation Needs (07/11/2021)   PRAPARE - Hydrologist (Medical): No    Lack of Transportation (Non-Medical): No  Physical Activity: Insufficiently Active (07/11/2021)   Exercise Vital Sign    Days of Exercise per Week: 1 day    Minutes of Exercise per Session: 20 min  Stress: No Stress Concern Present (07/11/2021)   [place]    Feeling of Stress : Only a little  Social Connections: Unknown (07/11/2021)   Social Connection and Isolation  Panel [NHANES]    Frequency of Communication with Friends and Family: Three times a week    Frequency of Social Gatherings with Friends and Family: Once a week    Attends Religious Services: Patient refused    Active Member of Clubs or Organizations: No    Attends Engineer, structural: Not on file    Marital Status: Married  Catering manager Violence: Not on file    Past Surgical History:  Procedure Laterality Date   CARDIAC CATHETERIZATION  2009    History reviewed. No pertinent family history.  Allergies  Allergen Reactions   Sulfa Antibiotics Rash    No current outpatient medications on file prior to  visit.   No current facility-administered medications on file prior to visit.    BP 110/82   Temp 98.2 F (36.8 C) (Oral)   Ht 6\' 2"  (1.88 m)   Wt 260 lb (117.9 kg)   BMI 33.38 kg/m       Objective:   Physical Exam Vitals and nursing note reviewed.  Constitutional:      General: He is not in acute distress.    Appearance: Normal appearance. He is well-developed and overweight.  HENT:     Head: Normocephalic and atraumatic.     Right Ear: Tympanic membrane, ear canal and external ear normal. There is no impacted cerumen.     Left Ear: Tympanic membrane, ear canal and external ear normal. There is no impacted cerumen.     Nose: Nose normal. No congestion or rhinorrhea.     Mouth/Throat:     Mouth: Mucous membranes are moist.     Pharynx: Oropharynx is clear. No oropharyngeal exudate or posterior oropharyngeal erythema.  Eyes:     General:        Right eye: No discharge.        Left eye: No discharge.     Extraocular Movements: Extraocular movements intact.     Conjunctiva/sclera: Conjunctivae normal.     Pupils: Pupils are equal, round, and reactive to light.  Neck:     Vascular: No carotid bruit.     Trachea: No tracheal deviation.  Cardiovascular:     Rate and Rhythm: Normal rate and regular rhythm.     Pulses: Normal pulses.     Heart sounds: Normal heart sounds. No murmur heard.    No friction rub. No gallop.  Pulmonary:     Effort: Pulmonary effort is normal. No respiratory distress.     Breath sounds: Normal breath sounds. No stridor. No wheezing, rhonchi or rales.  Chest:     Chest wall: No tenderness.  Abdominal:     General: Bowel sounds are normal. There is no distension.     Palpations: Abdomen is soft. There is no mass.     Tenderness: There is no abdominal tenderness. There is no right CVA tenderness, left CVA tenderness, guarding or rebound.     Hernia: No hernia is present.  Musculoskeletal:        General: No swelling, tenderness, deformity or  signs of injury. Normal range of motion.     Right lower leg: No edema.     Left lower leg: No edema.  Lymphadenopathy:     Cervical: No cervical adenopathy.  Skin:    General: Skin is warm and dry.     Capillary Refill: Capillary refill takes less than 2 seconds.     Coloration: Skin is not jaundiced or pale.     Findings: No bruising,  erythema, lesion or rash.  Neurological:     General: No focal deficit present.     Mental Status: He is alert and oriented to person, place, and time.     Cranial Nerves: No cranial nerve deficit.     Sensory: No sensory deficit.     Motor: No weakness.     Coordination: Coordination normal.     Gait: Gait normal.     Deep Tendon Reflexes: Reflexes normal.  Psychiatric:        Mood and Affect: Mood normal.        Behavior: Behavior normal.        Thought Content: Thought content normal.        Judgment: Judgment normal.        Assessment & Plan:  1. Routine general medical examination at a health care facility - Encouraged lifestyle modifications  - Follow up in one year or sooner if needed - CBC with Differential/Platelet; Future - Comprehensive metabolic panel; Future - Hemoglobin A1c; Future - Lipid panel; Future - TSH; Future  2. Erectile dysfunction, unspecified erectile dysfunction type  - tadalafil (CIALIS) 20 MG tablet; Take 1 tablet (20 mg total) by mouth every other day as needed for erectile dysfunction.  Dispense: 20 tablet; Refill: 3  3. Colon cancer screening  - Ambulatory referral to Gastroenterology  4. Mixed hyperlipidemia - Continue with statin  - CBC with Differential/Platelet; Future - Comprehensive metabolic panel; Future - Hemoglobin A1c; Future - Lipid panel; Future - TSH; Future  5. Anxiety - Well controlled.  - No change in medications  - sertraline (ZOLOFT) 100 MG tablet; Take 1 tablet (100 mg total) by mouth daily.  Dispense: 90 tablet; Refill: 1 - CBC with Differential/Platelet; Future -  Comprehensive metabolic panel; Future - Hemoglobin A1c; Future - Lipid panel; Future - TSH; Future  6. Overweight - Encouraged weight loss through diet and exercise - CBC with Differential/Platelet; Future - Comprehensive metabolic panel; Future - Hemoglobin A1c; Future - Lipid panel; Future - TSH; Future  7. Prostate cancer screening  - PSA; Future  8. Migraine with aura and without status migrainosus, not intractable  - Diclofenac Potassium,Migraine, 50 MG PACK; Take 50 mg by mouth daily PRN  Dispense: 4 each; Refill: 6  Shirline Frees, NP

## 2022-08-26 ENCOUNTER — Other Ambulatory Visit: Payer: Self-pay | Admitting: Adult Health

## 2022-08-26 DIAGNOSIS — G43109 Migraine with aura, not intractable, without status migrainosus: Secondary | ICD-10-CM

## 2022-09-23 DIAGNOSIS — R208 Other disturbances of skin sensation: Secondary | ICD-10-CM | POA: Diagnosis not present

## 2022-09-23 DIAGNOSIS — D0472 Carcinoma in situ of skin of left lower limb, including hip: Secondary | ICD-10-CM | POA: Diagnosis not present

## 2022-09-23 DIAGNOSIS — D225 Melanocytic nevi of trunk: Secondary | ICD-10-CM | POA: Diagnosis not present

## 2022-09-23 DIAGNOSIS — L814 Other melanin hyperpigmentation: Secondary | ICD-10-CM | POA: Diagnosis not present

## 2022-09-23 DIAGNOSIS — C44729 Squamous cell carcinoma of skin of left lower limb, including hip: Secondary | ICD-10-CM | POA: Diagnosis not present

## 2022-09-23 DIAGNOSIS — D492 Neoplasm of unspecified behavior of bone, soft tissue, and skin: Secondary | ICD-10-CM | POA: Diagnosis not present

## 2022-09-23 DIAGNOSIS — L918 Other hypertrophic disorders of the skin: Secondary | ICD-10-CM | POA: Diagnosis not present

## 2022-09-23 DIAGNOSIS — L821 Other seborrheic keratosis: Secondary | ICD-10-CM | POA: Diagnosis not present

## 2022-11-24 ENCOUNTER — Other Ambulatory Visit: Payer: Self-pay | Admitting: Adult Health

## 2022-11-24 DIAGNOSIS — F419 Anxiety disorder, unspecified: Secondary | ICD-10-CM

## 2022-12-04 ENCOUNTER — Encounter: Payer: Self-pay | Admitting: Internal Medicine

## 2022-12-18 ENCOUNTER — Ambulatory Visit (AMBULATORY_SURGERY_CENTER): Payer: BC Managed Care – PPO | Admitting: *Deleted

## 2022-12-18 VITALS — Ht 74.0 in | Wt 250.0 lb

## 2022-12-18 DIAGNOSIS — Z1211 Encounter for screening for malignant neoplasm of colon: Secondary | ICD-10-CM

## 2022-12-18 MED ORDER — NA SULFATE-K SULFATE-MG SULF 17.5-3.13-1.6 GM/177ML PO SOLN: 1.0000 | Freq: Once | ORAL | 0 refills | Status: AC

## 2022-12-18 NOTE — Progress Notes (Signed)
Pt's name and DOB verified at the beginning of the pre-visit.  Pt denies any difficulty with ambulating,sitting, laying down or rolling side to side Gave both [facility] main # and MD on call # prior to instructions.  No egg or soy allergy known to patient  Patient denies ever being intubated Pt has no issues moving head neck or swallowing No FH of Malignant Hyperthermia Pt is not on diet pills Pt is not on home 02  Pt is not on blood thinners  Pt denies issues with constipation  Pt is not on dialysis Pt denise any abnormal heart rhythms  Pt denies any upcoming cardiac testing Pt encouraged to use to use Singlecare or Goodrx to reduce cost  Patient's chart reviewed by Cathlyn Parsons CNRA prior to pre-visit and patient appropriate for the [facility].  Pre-visit completed and red dot placed by patient's name on their procedure day (on provider's schedule).  . Visit by phone Pt states weight is 250lb Instructed pt why it is important to and  to call if they have any changes in health or new medications. Directed them to the # given and on instructions.   Pt states they will.  Instructions reviewed with pt and pt states understanding. Instructed to review again prior to procedure. Pt states they will.  Instructions sent by mail with coupon and by my chart

## 2022-12-29 ENCOUNTER — Encounter: Payer: BC Managed Care – PPO | Admitting: Internal Medicine

## 2022-12-31 ENCOUNTER — Encounter: Payer: Self-pay | Admitting: Internal Medicine

## 2023-01-09 ENCOUNTER — Encounter: Payer: Self-pay | Admitting: Internal Medicine

## 2023-01-09 ENCOUNTER — Telehealth: Payer: Self-pay

## 2023-01-09 ENCOUNTER — Ambulatory Visit (AMBULATORY_SURGERY_CENTER): Payer: BC Managed Care – PPO | Admitting: Internal Medicine

## 2023-01-09 VITALS — BP 124/74 | HR 62 | Temp 98.7°F | Resp 11 | Ht 74.0 in | Wt 250.0 lb

## 2023-01-09 DIAGNOSIS — D123 Benign neoplasm of transverse colon: Secondary | ICD-10-CM

## 2023-01-09 DIAGNOSIS — Z1211 Encounter for screening for malignant neoplasm of colon: Secondary | ICD-10-CM

## 2023-01-09 MED ORDER — SODIUM CHLORIDE 0.9 % IV SOLN: 500.0000 mL | Freq: Once | INTRAVENOUS | Status: DC

## 2023-01-09 NOTE — Progress Notes (Signed)
GASTROENTEROLOGY PROCEDURE H&P NOTE   Primary Care Physician: Shirline Frees, NP    Reason for Procedure:   Colon cancer screening  Plan:    Colonoscopy  Patient is appropriate for endoscopic procedure(s) in the ambulatory ([facility]) setting.  The nature of the procedure, as well as the risks, benefits, and alternatives were carefully and thoroughly reviewed with the patient. Ample time for discussion and questions allowed. The patient understood, was satisfied, and agreed to proceed.     HPI: Edward Nash is a 47 y.o. male who presents for colonoscopy for colon cancer screening. Denies blood in stools, changes in bowel habits, or unintentional weight loss. Denies family history of colon cancer. This is his first colonoscopy.   Past Medical History:  Diagnosis Date   Anxiety    Hyperlipidemia    Migraines     Past Surgical History:  Procedure Laterality Date   CARDIAC CATHETERIZATION  2009   COLONOSCOPY      Prior to Admission medications   Medication Sig Start Date End Date Taking? Authorizing Provider  rosuvastatin (CRESTOR) 20 MG tablet TAKE 1 TABLET BY MOUTH EVERYDAY AT BEDTIME 05/21/22  Yes Nafziger, Kandee Keen, NP  sertraline (ZOLOFT) 100 MG tablet TAKE 1 TABLET BY MOUTH EVERY DAY 11/26/22  Yes Nafziger, Kandee Keen, NP  Diclofenac Potassium,Migraine, 50 MG PACK TAKE 50 MG BY MOUTH DAILY AS NEEDED Patient not taking: Reported on 01/09/2023 08/26/22   Shirline Frees, NP  tadalafil (CIALIS) 20 MG tablet Take 1 tablet (20 mg total) by mouth every other day as needed for erectile dysfunction. 05/21/22   Nafziger, Kandee Keen, NP    Current Outpatient Medications  Medication Sig Dispense Refill   rosuvastatin (CRESTOR) 20 MG tablet TAKE 1 TABLET BY MOUTH EVERYDAY AT BEDTIME 90 tablet 3   sertraline (ZOLOFT) 100 MG tablet TAKE 1 TABLET BY MOUTH EVERY DAY 90 tablet 1   Diclofenac Potassium,Migraine, 50 MG PACK TAKE 50 MG BY MOUTH DAILY AS NEEDED (Patient not taking: Reported on 01/09/2023)  9 each 1   tadalafil (CIALIS) 20 MG tablet Take 1 tablet (20 mg total) by mouth every other day as needed for erectile dysfunction. 20 tablet 3   Current Facility-Administered Medications  Medication Dose Route Frequency Provider Last Rate Last Admin   0.9 %  sodium chloride infusion  500 mL Intravenous Once Imogene Burn, MD        Allergies as of 01/09/2023 - Review Complete 01/09/2023  Allergen Reaction Noted   Sulfa antibiotics Rash 01/07/2013    Family History  Problem Relation Age of Onset   Colon cancer Neg Hx    Colon polyps Neg Hx    Esophageal cancer Neg Hx    Rectal cancer Neg Hx    Stomach cancer Neg Hx     Social History   Socioeconomic History   Marital status: Married    Spouse name: Not on file   Number of children: Not on file   Years of education: Not on file   Highest education level: Master's degree (e.g., MA, MS, MEng, MEd, MSW, MBA)  Occupational History   Not on file  Tobacco Use   Smoking status: Never   Smokeless tobacco: Former    Types: Associate Professor Use: Never used  Substance and Sexual Activity   Alcohol use: Yes    Alcohol/week: 1.0 standard drink of alcohol    Types: 1 Cans of beer per week    Comment: occ   Drug  use: No   Sexual activity: Not on file  Other Topics Concern   Not on file  Social History Narrative   Not on file   Social Determinants of Health   Financial Resource Strain: Low Risk  (07/11/2021)   Overall Financial Resource Strain (CARDIA)    Difficulty of Paying Living Expenses: Not hard at all  Food Insecurity: No Food Insecurity (07/11/2021)   Hunger Vital Sign    Worried About Running Out of Food in the Last Year: Never true    Ran Out of Food in the Last Year: Never true  Transportation Needs: No Transportation Needs (07/11/2021)   PRAPARE - Administrator, Civil Service (Medical): No    Lack of Transportation (Non-Medical): No  Physical Activity: Insufficiently Active (07/11/2021)    Exercise Vital Sign    Days of Exercise per Week: 1 day    Minutes of Exercise per Session: 20 min  Stress: No Stress Concern Present (07/11/2021)   [employer] of Occupational Health - Occupational Stress Questionnaire    Feeling of Stress : Only a little  Social Connections: Unknown (07/11/2021)   Social Connection and Isolation Panel [NHANES]    Frequency of Communication with Friends and Family: Three times a week    Frequency of Social Gatherings with Friends and Family: Once a week    Attends Religious Services: Patient declined    Database administrator or Organizations: No    Attends Engineer, structural: Not on file    Marital Status: Married  Catering manager Violence: Not on file    Physical Exam: Vital signs in last 24 hours: BP (!) 142/76   Pulse 77   Temp 98.7 F (37.1 C) (Skin)   Ht 6\' 2"  (1.88 m)   Wt 250 lb (113.4 kg)   SpO2 96%   BMI 32.10 kg/m  GEN: NAD EYE: Sclerae anicteric ENT: MMM CV: Non-tachycardic Pulm: No increased work of breathing GI: Soft, NT/ND NEURO:  Alert & Oriented   Eulah Pont, MD [facility]  01/09/2023 9:22 AM

## 2023-01-09 NOTE — Op Note (Signed)
[facility] Patient Name: Edward Nash Procedure Date: 01/09/2023 9:39 AM MRN: [id number] Endoscopist: Particia Lather , , [id number] Age: 47 Referring MD:  Date of Birth: August 09, 1975 Gender: Male Account #: [id number] Procedure:                Colonoscopy Indications:              Screening for colorectal malignant neoplasm, This                            is the patient's first colonoscopy Medicines:                Monitored Anesthesia Care Procedure:                Pre-Anesthesia Assessment:                           - Prior to the procedure, a History and Physical                            was performed, and patient medications and                            allergies were reviewed. The patient's tolerance of                            previous anesthesia was also reviewed. The risks                            and benefits of the procedure and the sedation                            options and risks were discussed with the patient.                            All questions were answered, and informed consent                            was obtained. Prior Anticoagulants: The patient has                            taken no anticoagulant or antiplatelet agents. ASA                            Grade Assessment: II - A patient with mild systemic                            disease. After reviewing the risks and benefits,                            the patient was deemed in satisfactory condition to                            undergo the procedure.  After obtaining informed consent, the colonoscope                            was passed under direct vision. Throughout the                            procedure, the patient's blood pressure, pulse, and                            oxygen saturations were monitored continuously. The                            CF [id number] #[id number] was introduced through the anus                            and advanced to  the the terminal ileum. The                            colonoscopy was performed without difficulty. The                            patient tolerated the procedure well. The quality                            of the bowel preparation was good. The terminal                            ileum, ileocecal valve, appendiceal orifice, and                            rectum were photographed. Scope In: 9:43:44 AM Scope Out: 10:02:01 AM Scope Withdrawal Time: 0 hours 14 minutes 7 seconds  Total Procedure Duration: 0 hours 18 minutes 17 seconds  Findings:                 The terminal ileum appeared normal.                           A 3 mm polyp was found in the transverse colon. The                            polyp was sessile. The polyp was removed with a                            cold snare. Resection and retrieval were complete.                           Non-bleeding internal hemorrhoids were found during                            retroflexion.                           Anoretal wart was noted on rectal exam. Complications:  No immediate complications. Estimated Blood Loss:     Estimated blood loss was minimal. Impression:               - The examined portion of the ileum was normal.                           - One 3 mm polyp in the transverse colon, removed                            with a cold snare. Resected and retrieved.                           - Non-bleeding internal hemorrhoids.                           - Anorectal wart. Recommendation:           - Discharge patient to home (with escort).                           - Await pathology results.                           - Referral to surgery for evaluation of anorectal                            wart.                           - The findings and recommendations were discussed                            with the patient. Dr Particia Lather "Alan Ripper" Leonides Schanz,  01/09/2023 10:07:56 AM

## 2023-01-09 NOTE — Telephone Encounter (Signed)
Referral to colorectal surgery Dr. Cliffton Asters for anorectal wart found on colonoscopy faxed with insurance , demographics and procedure report.

## 2023-01-09 NOTE — Progress Notes (Signed)
Pt's states no medical or surgical changes since previsit or office visit. VS assessed by C.W 

## 2023-01-09 NOTE — Progress Notes (Signed)
Nad vss trans to pacu 

## 2023-01-09 NOTE — Patient Instructions (Signed)

## 2023-01-12 ENCOUNTER — Telehealth: Payer: Self-pay | Admitting: *Deleted

## 2023-01-12 NOTE — Telephone Encounter (Signed)
  Follow up Call-     01/09/2023    9:03 AM  Call back number  Post procedure Call Back phone  # [phone]  Permission to leave phone message Yes     Patient questions:  Do you have a fever, pain , or abdominal swelling? No. Pain Score  0 *  Have you tolerated food without any problems? Yes.    Have you been able to return to your normal activities? Yes.    Do you have any questions about your discharge instructions: Diet   No. Medications  No. Follow up visit  No.  Do you have questions or concerns about your Care? No.  Actions: * If pain score is 4 or above: No action needed, pain <4.

## 2023-01-13 ENCOUNTER — Encounter: Payer: Self-pay | Admitting: Internal Medicine

## 2023-02-10 DIAGNOSIS — K6289 Other specified diseases of anus and rectum: Secondary | ICD-10-CM | POA: Diagnosis not present

## 2023-02-11 ENCOUNTER — Encounter: Payer: Self-pay | Admitting: Internal Medicine

## 2023-02-24 ENCOUNTER — Telehealth: Payer: Self-pay | Admitting: *Deleted

## 2023-02-24 NOTE — Telephone Encounter (Signed)
Called patient to inform of Dr. Derek Mound instructions, unable to contact patient. LMTCB

## 2023-05-15 ENCOUNTER — Other Ambulatory Visit: Payer: Self-pay | Admitting: Adult Health

## 2023-05-15 DIAGNOSIS — F419 Anxiety disorder, unspecified: Secondary | ICD-10-CM

## 2023-05-28 ENCOUNTER — Other Ambulatory Visit: Payer: BC Managed Care – PPO

## 2023-05-28 ENCOUNTER — Encounter: Payer: Self-pay | Admitting: Adult Health

## 2023-05-28 ENCOUNTER — Ambulatory Visit (INDEPENDENT_AMBULATORY_CARE_PROVIDER_SITE_OTHER): Payer: BC Managed Care – PPO | Admitting: Adult Health

## 2023-05-28 VITALS — BP 114/78 | HR 74 | Temp 99.0°F | Ht 74.0 in | Wt 259.0 lb

## 2023-05-28 DIAGNOSIS — Z0000 Encounter for general adult medical examination without abnormal findings: Secondary | ICD-10-CM

## 2023-05-28 DIAGNOSIS — F419 Anxiety disorder, unspecified: Secondary | ICD-10-CM

## 2023-05-28 DIAGNOSIS — N529 Male erectile dysfunction, unspecified: Secondary | ICD-10-CM

## 2023-05-28 DIAGNOSIS — Z114 Encounter for screening for human immunodeficiency virus [HIV]: Secondary | ICD-10-CM

## 2023-05-28 DIAGNOSIS — Z23 Encounter for immunization: Secondary | ICD-10-CM

## 2023-05-28 DIAGNOSIS — E782 Mixed hyperlipidemia: Secondary | ICD-10-CM

## 2023-05-28 DIAGNOSIS — G43109 Migraine with aura, not intractable, without status migrainosus: Secondary | ICD-10-CM | POA: Diagnosis not present

## 2023-05-28 DIAGNOSIS — Z125 Encounter for screening for malignant neoplasm of prostate: Secondary | ICD-10-CM

## 2023-05-28 DIAGNOSIS — Z1159 Encounter for screening for other viral diseases: Secondary | ICD-10-CM | POA: Diagnosis not present

## 2023-05-28 DIAGNOSIS — K629 Disease of anus and rectum, unspecified: Secondary | ICD-10-CM

## 2023-05-28 DIAGNOSIS — R7309 Other abnormal glucose: Secondary | ICD-10-CM | POA: Diagnosis not present

## 2023-05-28 LAB — HEMOGLOBIN A1C: Hgb A1c MFr Bld: 5.7 % (ref 4.6–6.5)

## 2023-05-28 LAB — COMPREHENSIVE METABOLIC PANEL
ALT: 18 U/L (ref 0–53)
AST: 17 U/L (ref 0–37)
Albumin: 4.6 g/dL (ref 3.5–5.2)
Alkaline Phosphatase: 49 U/L (ref 39–117)
BUN: 16 mg/dL (ref 6–23)
CO2: 30 meq/L (ref 19–32)
Calcium: 9.8 mg/dL (ref 8.4–10.5)
Chloride: 104 meq/L (ref 96–112)
Creatinine, Ser: 0.91 mg/dL (ref 0.40–1.50)
GFR: 99.21 mL/min (ref >60.00)
Glucose, Bld: 107 mg/dL — ABNORMAL HIGH (ref 70–99)
Potassium: 4.1 meq/L (ref 3.5–5.1)
Sodium: 141 meq/L (ref 135–145)
Total Bilirubin: 0.7 mg/dL (ref 0.2–1.2)
Total Protein: 7 g/dL (ref 6.0–8.3)

## 2023-05-28 LAB — CBC
HCT: 45.1 % (ref 39.0–52.0)
Hemoglobin: 14.8 g/dL (ref 13.0–17.0)
MCHC: 32.8 g/dL (ref 30.0–36.0)
MCV: 90 fL (ref 78.0–100.0)
Platelets: 233 10*3/uL (ref 150.0–400.0)
RBC: 5.01 Mil/uL (ref 4.22–5.81)
RDW: 12.8 % (ref 11.5–15.5)
WBC: 7.3 10*3/uL (ref 4.0–10.5)

## 2023-05-28 LAB — LIPID PANEL
Cholesterol: 135 mg/dL (ref 0–200)
HDL: 52.1 mg/dL (ref >39.00)
LDL Cholesterol: 67 mg/dL (ref 0–99)
NonHDL: 82.75
Total CHOL/HDL Ratio: 3
Triglycerides: 79 mg/dL (ref 0.0–149.0)
VLDL: 15.8 mg/dL (ref 0.0–40.0)

## 2023-05-28 LAB — TSH: TSH: 2.37 u[IU]/mL (ref 0.35–5.50)

## 2023-05-28 LAB — PSA: PSA: 0.85 ng/mL (ref 0.10–4.00)

## 2023-05-28 MED ORDER — TADALAFIL 20 MG PO TABS: 20.0000 mg | ORAL_TABLET | ORAL | 3 refills | Status: AC | PRN

## 2023-05-28 MED ORDER — ROSUVASTATIN CALCIUM 20 MG PO TABS: ORAL_TABLET | ORAL | 3 refills | Status: DC

## 2023-05-28 MED ORDER — DICLOFENAC POTASSIUM(MIGRAINE) 50 MG PO PACK: PACK | ORAL | 1 refills | Status: DC

## 2023-05-28 MED ORDER — SERTRALINE HCL 100 MG PO TABS: 100.0000 mg | ORAL_TABLET | Freq: Every day | ORAL | 1 refills | Status: DC

## 2023-05-28 NOTE — Progress Notes (Signed)
Subjective:    Patient ID: Edward Nash, male    DOB: 01-25-1976, 47 y.o.   MRN: [id number]  HPI Patient presents for yearly preventative medicine examination. He is a pleasant 47 year old male who  has a past medical history of Anxiety, Hyperlipidemia, and Migraines.  Hyperlipidemia -managed with Crestor 20 mg nightly.  He denies myalgia or fatigue Lab Results  Component Value Date   CHOL 156 05/21/2022   HDL 56.90 05/21/2022   LDLCALC 81 05/21/2022   LDLDIRECT 145.6 08/19/2013   TRIG 92.0 05/21/2022   CHOLHDL 3 05/21/2022   Anxiety-controlled with Zoloft 100 mg daily  Erectile dysfunction-uses Cialis 20 mg as needed  Migraine Headaches - uses Diclofenac  as needed   Perianal lesions noted on his colonoscopy of having a anal wart. He went and saw Dr. Cliffton Asters at [facility] who was not sure if it was a anal wart or trauma from hemorrhoidal tag. He would like my option today   All immunizations and health maintenance protocols were reviewed with the patient and needed orders were placed.  Appropriate screening laboratory values were ordered for the patient including screening of hyperlipidemia, renal function and hepatic function. If indicated by BPH, a PSA was ordered.  Medication reconciliation,  past medical history, social history, problem list and allergies were reviewed in detail with the patient  Goals were established with regard to weight loss, exercise, and  diet in compliance with medications. He started weight watchers about 3 weeks ago and has noticed he has lost weight He is not exercising much outside of walking his dog.   Wt Readings from Last 3 Encounters:  05/28/23 259 lb (117.5 kg)  01/09/23 250 lb (113.4 kg)  12/18/22 250 lb (113.4 kg)   He is up to date on routine colon cancer screening      Review of Systems  Constitutional: Negative.   HENT: Negative.    Eyes: Negative.   Respiratory: Negative.    Cardiovascular: Negative.    Gastrointestinal: Negative.   Endocrine: Negative.   Genitourinary: Negative.   Musculoskeletal: Negative.   Skin: Negative.   Allergic/Immunologic: Negative.   Neurological: Negative.   Hematological: Negative.   Psychiatric/Behavioral: Negative.    All other systems reviewed and are negative.  Past Medical History:  Diagnosis Date   Anxiety    Hyperlipidemia    Migraines     Social History   Socioeconomic History   Marital status: Married    Spouse name: Not on file   Number of children: Not on file   Years of education: Not on file   Highest education level: Master's degree (e.g., MA, MS, MEng, MEd, MSW, MBA)  Occupational History   Not on file  Tobacco Use   Smoking status: Never   Smokeless tobacco: Former    Types: Designer, multimedia Use   Vaping status: Never Used  Substance and Sexual Activity   Alcohol use: Yes    Alcohol/week: 1.0 standard drink of alcohol    Types: 1 Cans of beer per week    Comment: occ   Drug use: No   Sexual activity: Not on file  Other Topics Concern   Not on file  Social History Narrative   Not on file   Social Determinants of Health   Financial Resource Strain: Low Risk  (07/11/2021)   Overall Financial Resource Strain (CARDIA)    Difficulty of Paying Living Expenses: Not hard at all  Food Insecurity: No Food  Insecurity (07/11/2021)   Hunger Vital Sign    Worried About Running Out of Food in the Last Year: Never true    Ran Out of Food in the Last Year: Never true  Transportation Needs: No Transportation Needs (07/11/2021)   PRAPARE - Administrator, Civil Service (Medical): No    Lack of Transportation (Non-Medical): No  Physical Activity: Insufficiently Active (07/11/2021)   Exercise Vital Sign    Days of Exercise per Week: 1 day    Minutes of Exercise per Session: 20 min  Stress: No Stress Concern Present (07/11/2021)   [employer] of Occupational Health - Occupational Stress Questionnaire    Feeling of  Stress : Only a little  Social Connections: Unknown (07/11/2021)   Social Connection and Isolation Panel [NHANES]    Frequency of Communication with Friends and Family: Three times a week    Frequency of Social Gatherings with Friends and Family: Once a week    Attends Religious Services: Patient declined    Database administrator or Organizations: No    Attends Engineer, structural: Not on file    Marital Status: Married  Catering manager Violence: Not on file    Past Surgical History:  Procedure Laterality Date   CARDIAC CATHETERIZATION  2009   COLONOSCOPY      Family History  Problem Relation Age of Onset   Colon cancer Neg Hx    Colon polyps Neg Hx    Esophageal cancer Neg Hx    Rectal cancer Neg Hx    Stomach cancer Neg Hx     Allergies  Allergen Reactions   Sulfa Antibiotics Rash    No current outpatient medications on file prior to visit.   No current facility-administered medications on file prior to visit.    BP 114/78   Temp 99 F (37.2 C) (Oral)   Ht 6\' 2"  (1.88 m)   Wt 259 lb (117.5 kg)   BMI 33.25 kg/m       Objective:   Physical Exam Vitals and nursing note reviewed.  Constitutional:      General: He is not in acute distress.    Appearance: Normal appearance. He is not ill-appearing.  HENT:     Head: Normocephalic and atraumatic.     Right Ear: Tympanic membrane, ear canal and external ear normal. There is no impacted cerumen.     Left Ear: Tympanic membrane, ear canal and external ear normal. There is no impacted cerumen.     Nose: Nose normal. No congestion or rhinorrhea.     Mouth/Throat:     Mouth: Mucous membranes are moist.     Pharynx: Oropharynx is clear.  Eyes:     Extraocular Movements: Extraocular movements intact.     Conjunctiva/sclera: Conjunctivae normal.     Pupils: Pupils are equal, round, and reactive to light.  Neck:     Vascular: No carotid bruit.  Cardiovascular:     Rate and Rhythm: Normal rate and  regular rhythm.     Pulses: Normal pulses.     Heart sounds: No murmur heard.    No friction rub. No gallop.  Pulmonary:     Effort: Pulmonary effort is normal.     Breath sounds: Normal breath sounds.  Abdominal:     General: Abdomen is flat. Bowel sounds are normal. There is no distension.     Palpations: Abdomen is soft. There is no mass.     Tenderness: There is no abdominal  tenderness. There is no guarding or rebound.     Hernia: No hernia is present.  Genitourinary:    Comments:  Lesion with verrucous type appearance Musculoskeletal:        General: Normal range of motion.     Cervical back: Normal range of motion and neck supple.  Lymphadenopathy:     Cervical: No cervical adenopathy.  Skin:    General: Skin is warm and dry.     Capillary Refill: Capillary refill takes less than 2 seconds.  Neurological:     General: No focal deficit present.     Mental Status: He is alert and oriented to person, place, and time.  Psychiatric:        Mood and Affect: Mood normal.        Behavior: Behavior normal.        Thought Content: Thought content normal.        Judgment: Judgment normal.       Assessment & Plan:  1. Routine general medical examination at a health care facility Today patient counseled on age appropriate routine health concerns for screening and prevention, each reviewed and up to date or declined. Immunizations reviewed and up to date or declined. Labs ordered and reviewed. Risk factors for depression reviewed and negative. Hearing function and visual acuity are intact. ADLs screened and addressed as needed. Functional ability and level of safety reviewed and appropriate. Education, counseling and referrals performed based on assessed risks today. Patient provided with a copy of personalized plan for preventive services. - Follow up in one year or sooner if needed - Continue with weight loss measures.   2. Erectile dysfunction, unspecified erectile dysfunction  type  - Lipid panel; Future - TSH; Future - CBC; Future - Comprehensive metabolic panel; Future - tadalafil (CIALIS) 20 MG tablet; Take 1 tablet (20 mg total) by mouth every other day as needed for erectile dysfunction.  Dispense: 20 tablet; Refill: 3  3. Mixed hyperlipidemia - Continue with Crestor and lifestyle modifications  - Lipid panel; Future - TSH; Future - CBC; Future - Comprehensive metabolic panel; Future  4. Anxiety - Continue with Zoloft 100 mg daily  - Lipid panel; Future - TSH; Future - CBC; Future - Comprehensive metabolic panel; Future - sertraline (ZOLOFT) 100 MG tablet; Take 1 tablet (100 mg total) by mouth daily.  Dispense: 90 tablet; Refill: 1  5. Prostate cancer screening  - PSA; Future - HIV Antibody (routine testing w rflx); Future  6. Migraine with aura and without status migrainosus, not intractable  - Lipid panel; Future - TSH; Future - CBC; Future - Comprehensive metabolic panel; Future - Diclofenac Potassium,Migraine, 50 MG PACK; TAKE 50 MG BY MOUTH DAILY AS NEEDED  Dispense: 9 each; Refill: 1  7. Encounter for screening for HIV  - HIV Antibody (routine testing w rflx); Future  8. Need for hepatitis C screening test  - Hep C Antibody; Future  9. Need for tetanus booster  - Tdap vaccine greater than or equal to 7yo IM  10. 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 Advised follow up with [facility]     Shirline Frees, NP

## 2023-05-28 NOTE — Patient Instructions (Addendum)
It was great seeing you today   We will follow up with you regarding your lab work   Please let me know if you need anything   Continue with weight loss measures.   You saw Dr. Marin Olp at [facility]   Address: [address], [place], [place] [address] Phone: [phone]

## 2023-05-30 LAB — HEPATITIS C ANTIBODY: Hepatitis C Ab: NONREACTIVE

## 2023-05-30 LAB — HIV ANTIBODY (ROUTINE TESTING W REFLEX): HIV 1&2 Ab, 4th Generation: NONREACTIVE

## 2023-07-30 ENCOUNTER — Telehealth: Payer: Self-pay | Admitting: Pharmacy Technician

## 2023-07-30 ENCOUNTER — Other Ambulatory Visit (HOSPITAL_COMMUNITY): Payer: Self-pay

## 2023-07-30 NOTE — Telephone Encounter (Signed)
 Pharmacy Patient Advocate Encounter   Received notification from CoverMyMeds that prior authorization for Diclofenac  Potassium(Migraine) 50MG  packets is required/requested.   Insurance verification completed.   The patient is insured through [facility] .   Per test claim: PA required; PA submitted to above mentioned insurance via CoverMyMeds Key/confirmation #/EOC [id number] Status is pending

## 2023-07-31 NOTE — Telephone Encounter (Signed)
 Pharmacy Patient Advocate Encounter  Received notification from [facility] that Prior Authorization for Diclofenac  Potassium 50 mg packets has been DENIED.  Full denial letter will be uploaded to the media tab. See denial reason below.   PA #/Case ID/Reference #: [id number]  Denial Reason: The request does not meet the definition of medical necessity found in the emcor.

## 2023-08-03 NOTE — Telephone Encounter (Signed)
**Note De-identified  [place] Obfuscation** Please advise 

## 2023-08-04 NOTE — Telephone Encounter (Signed)
 Pt advised that I sent to [facility] for substitute since it is not covered by insurance.

## 2023-08-05 ENCOUNTER — Other Ambulatory Visit: Payer: Self-pay | Admitting: Adult Health

## 2023-08-05 MED ORDER — DICLOFENAC SODIUM 50 MG PO TBEC: 50.0000 mg | DELAYED_RELEASE_TABLET | Freq: Two times a day (BID) | ORAL | 0 refills | Status: DC | PRN

## 2023-08-05 NOTE — Telephone Encounter (Signed)
 Noted.

## 2023-08-06 NOTE — Telephone Encounter (Signed)
 Patient notified of update  and verbalized understanding.

## 2023-08-21 ENCOUNTER — Encounter: Payer: Self-pay | Admitting: Adult Health

## 2023-08-21 ENCOUNTER — Encounter: Payer: Self-pay | Admitting: Internal Medicine

## 2023-08-21 NOTE — Telephone Encounter (Signed)
Please advise

## 2023-08-26 ENCOUNTER — Other Ambulatory Visit: Payer: Self-pay | Admitting: Adult Health

## 2023-08-26 DIAGNOSIS — K629 Disease of anus and rectum, unspecified: Secondary | ICD-10-CM

## 2023-08-26 NOTE — Telephone Encounter (Signed)
Please advise

## 2023-09-15 DIAGNOSIS — K629 Disease of anus and rectum, unspecified: Secondary | ICD-10-CM | POA: Diagnosis not present

## 2023-09-24 ENCOUNTER — Other Ambulatory Visit (HOSPITAL_COMMUNITY): Payer: Self-pay

## 2023-09-30 ENCOUNTER — Encounter: Payer: Self-pay | Admitting: Adult Health

## 2023-10-02 DIAGNOSIS — K6289 Other specified diseases of anus and rectum: Secondary | ICD-10-CM | POA: Diagnosis not present

## 2023-10-02 DIAGNOSIS — K649 Unspecified hemorrhoids: Secondary | ICD-10-CM | POA: Diagnosis not present

## 2023-10-02 DIAGNOSIS — K644 Residual hemorrhoidal skin tags: Secondary | ICD-10-CM | POA: Diagnosis not present

## 2023-10-02 DIAGNOSIS — K629 Disease of anus and rectum, unspecified: Secondary | ICD-10-CM | POA: Diagnosis not present

## 2023-10-13 DIAGNOSIS — K648 Other hemorrhoids: Secondary | ICD-10-CM | POA: Diagnosis not present

## 2023-10-19 ENCOUNTER — Other Ambulatory Visit (HOSPITAL_COMMUNITY): Payer: Self-pay

## 2023-11-05 ENCOUNTER — Other Ambulatory Visit (HOSPITAL_COMMUNITY): Payer: Self-pay

## 2023-12-21 ENCOUNTER — Other Ambulatory Visit: Payer: Self-pay | Admitting: Adult Health

## 2023-12-21 DIAGNOSIS — F419 Anxiety disorder, unspecified: Secondary | ICD-10-CM

## 2023-12-29 DIAGNOSIS — J029 Acute pharyngitis, unspecified: Secondary | ICD-10-CM | POA: Diagnosis not present

## 2024-01-14 ENCOUNTER — Telehealth: Payer: Self-pay

## 2024-01-14 ENCOUNTER — Other Ambulatory Visit (HOSPITAL_COMMUNITY): Payer: Self-pay

## 2024-01-14 NOTE — Telephone Encounter (Signed)
 Pharmacy Patient Advocate Encounter   Received notification from Onbase that prior authorization for Diclofenac  Potassium is required/requested.   Insurance verification completed.   The patient is insured through [facility] . This is the second request   Per test claim: see  previous encounter 07/31/23

## 2024-03-17 ENCOUNTER — Other Ambulatory Visit: Payer: Self-pay | Admitting: Adult Health

## 2024-03-17 ENCOUNTER — Telehealth: Payer: Self-pay

## 2024-03-17 DIAGNOSIS — F419 Anxiety disorder, unspecified: Secondary | ICD-10-CM

## 2024-03-17 MED ORDER — SERTRALINE HCL 100 MG PO TABS: 100.0000 mg | ORAL_TABLET | Freq: Every day | ORAL | 1 refills | Status: AC

## 2024-03-17 NOTE — Telephone Encounter (Signed)
 Copied from CRM #[id number]. Topic: Clinical - Medication Refill >> Mar 17, 2024  7:42 AM Pinkey ORN wrote: Medication: sertraline  (ZOLOFT ) 100 MG tablet  Has the patient contacted their pharmacy? Yes (Agent: If no, request that the patient contact the pharmacy for the refill. If patient does not wish to contact the pharmacy document the reason why and proceed with request.) (Agent: If yes, when and what did the pharmacy advise?)  This is the patient's preferred pharmacy:  [id number] - [place], [place] - [address] [place] [address] Phone: [phone] Fax: [phone]  Is this the correct pharmacy for this prescription? Yes If no, delete pharmacy and type the correct one.   Has the prescription been filled recently? No  Is the patient out of the medication? Yes  Has the patient been seen for an appointment in the last year OR does the patient have an upcoming appointment? Yes  Can we respond through MyChart? Yes  Agent: Please be advised that Rx refills may take up to 3 business days. We ask that you follow-up with your pharmacy.

## 2024-03-17 NOTE — Telephone Encounter (Signed)
 Copied from CRM #[id number]. Topic: Appointments - Appointment Scheduling >> Mar 17, 2024  7:36 AM Pinkey ORN wrote: Patient/patient representative is calling to schedule an appointment. Refer to attachments for appointment information. >> Mar 17, 2024  7:41 AM Pinkey ORN wrote: Patient is needing to schedule for his yearly annual but it has to be before the end of October, due to insurance purposes. Please contact patient if there's cancellations at [phone].

## 2024-03-18 NOTE — Telephone Encounter (Signed)
 Tried to call pt to schedule no answer. Will try again next week.

## 2024-03-23 NOTE — Telephone Encounter (Signed)
 Left message to return phone call.

## 2024-03-23 NOTE — Telephone Encounter (Signed)
 Pt has been scheduled.

## 2024-05-06 ENCOUNTER — Ambulatory Visit (INDEPENDENT_AMBULATORY_CARE_PROVIDER_SITE_OTHER): Admitting: Adult Health

## 2024-05-06 ENCOUNTER — Other Ambulatory Visit: Payer: Self-pay | Admitting: Adult Health

## 2024-05-06 ENCOUNTER — Ambulatory Visit: Payer: Self-pay | Admitting: Adult Health

## 2024-05-06 VITALS — BP 110/70 | HR 86 | Temp 97.5°F | Ht 74.0 in | Wt 253.0 lb

## 2024-05-06 DIAGNOSIS — E782 Mixed hyperlipidemia: Secondary | ICD-10-CM | POA: Diagnosis not present

## 2024-05-06 DIAGNOSIS — D72829 Elevated white blood cell count, unspecified: Secondary | ICD-10-CM

## 2024-05-06 DIAGNOSIS — N529 Male erectile dysfunction, unspecified: Secondary | ICD-10-CM | POA: Diagnosis not present

## 2024-05-06 DIAGNOSIS — F419 Anxiety disorder, unspecified: Secondary | ICD-10-CM

## 2024-05-06 DIAGNOSIS — G43109 Migraine with aura, not intractable, without status migrainosus: Secondary | ICD-10-CM | POA: Diagnosis not present

## 2024-05-06 DIAGNOSIS — Z125 Encounter for screening for malignant neoplasm of prostate: Secondary | ICD-10-CM

## 2024-05-06 DIAGNOSIS — Z0000 Encounter for general adult medical examination without abnormal findings: Secondary | ICD-10-CM | POA: Diagnosis not present

## 2024-05-06 LAB — LIPID PANEL
Cholesterol: 135 mg/dL (ref 0–200)
HDL: 56.1 mg/dL (ref >39.00)
LDL Cholesterol: 65 mg/dL (ref 0–99)
NonHDL: 78.45
Total CHOL/HDL Ratio: 2
Triglycerides: 69 mg/dL (ref 0.0–149.0)
VLDL: 13.8 mg/dL (ref 0.0–40.0)

## 2024-05-06 LAB — COMPREHENSIVE METABOLIC PANEL WITH GFR
ALT: 19 U/L (ref 0–53)
AST: 15 U/L (ref 0–37)
Albumin: 4.6 g/dL (ref 3.5–5.2)
Alkaline Phosphatase: 52 U/L (ref 39–117)
BUN: 16 mg/dL (ref 6–23)
CO2: 29 meq/L (ref 19–32)
Calcium: 9.5 mg/dL (ref 8.4–10.5)
Chloride: 100 meq/L (ref 96–112)
Creatinine, Ser: 0.85 mg/dL (ref 0.40–1.50)
GFR: 101.61 mL/min (ref >60.00)
Glucose, Bld: 95 mg/dL (ref 70–99)
Potassium: 4.3 meq/L (ref 3.5–5.1)
Sodium: 138 meq/L (ref 135–145)
Total Bilirubin: 0.6 mg/dL (ref 0.2–1.2)
Total Protein: 6.8 g/dL (ref 6.0–8.3)

## 2024-05-06 LAB — TSH: TSH: 2.51 u[IU]/mL (ref 0.35–5.50)

## 2024-05-06 LAB — CBC
HCT: 44.1 % (ref 39.0–52.0)
Hemoglobin: 14.7 g/dL (ref 13.0–17.0)
MCHC: 33.3 g/dL (ref 30.0–36.0)
MCV: 88.7 fl (ref 78.0–100.0)
Platelets: 212 K/uL (ref 150.0–400.0)
RBC: 4.97 Mil/uL (ref 4.22–5.81)
RDW: 12.6 % (ref 11.5–15.5)
WBC: 10.8 K/uL — ABNORMAL HIGH (ref 4.0–10.5)

## 2024-05-06 LAB — PSA: PSA: 0.63 ng/mL (ref 0.10–4.00)

## 2024-05-06 LAB — HEMOGLOBIN A1C: Hgb A1c MFr Bld: 5.7 % (ref 4.6–6.5)

## 2024-05-06 MED ORDER — DICLOFENAC SODIUM 50 MG PO TBEC: 50.0000 mg | DELAYED_RELEASE_TABLET | Freq: Two times a day (BID) | ORAL | 1 refills | Status: AC | PRN

## 2024-05-06 NOTE — Progress Notes (Signed)
 Subjective:    Patient ID: Edward Nash, male    DOB: 1975-10-18, 47 y.o.   MRN: [id number]  HPI Patient presents for yearly preventative medicine examination. He is a pleasant 48 year old male who  has a past medical history of Anxiety, Hyperlipidemia, and Migraines.  Hyperlipidemia -managed with Crestor  20 mg nightly.  He denies myalgia or fatigue Lab Results  Component Value Date   CHOL 135 05/28/2023   HDL 52.10 05/28/2023   LDLCALC 67 05/28/2023   LDLDIRECT 145.6 08/19/2013   TRIG 79.0 05/28/2023   CHOLHDL 3 05/28/2023   Anxiety-controlled with Zoloft  100 mg daily  Erectile dysfunction-uses Cialis  20 mg as needed  Migraine Headaches - uses Diclofenac   as needed  All immunizations and health maintenance protocols were reviewed with the patient and needed orders were placed.  Appropriate screening laboratory values were ordered for the patient including screening of hyperlipidemia, renal function and hepatic function. If indicated by BPH, a PSA was ordered.  Medication reconciliation,  past medical history, social history, problem list and allergies were reviewed in detail with the patient  Goals were established with regard to weight loss, exercise, and  diet in compliance with medications  Wt Readings from Last 3 Encounters:  05/06/24 253 lb (114.8 kg)  05/28/23 259 lb (117.5 kg)  01/09/23 250 lb (113.4 kg)   He is up to date on routine colon cancer screening  Review of Systems  Constitutional: Negative.   HENT: Negative.    Eyes: Negative.   Respiratory: Negative.    Cardiovascular: Negative.   Gastrointestinal: Negative.   Endocrine: Negative.   Genitourinary: Negative.   Musculoskeletal: Negative.   Skin: Negative.   Allergic/Immunologic: Negative.   Neurological: Negative.   Hematological: Negative.   Psychiatric/Behavioral: Negative.    All other systems reviewed and are negative.  Past Medical History:  Diagnosis Date   Anxiety     Hyperlipidemia    Migraines     Social History   Socioeconomic History   Marital status: Married    Spouse name: Not on file   Number of children: Not on file   Years of education: Not on file   Highest education level: Master's degree (e.g., MA, MS, MEng, MEd, MSW, MBA)  Occupational History   Not on file  Tobacco Use   Smoking status: Never   Smokeless tobacco: Former    Types: Designer, multimedia Use   Vaping status: Never Used  Substance and Sexual Activity   Alcohol use: Yes    Alcohol/week: 1.0 standard drink of alcohol    Types: 1 Cans of beer per week    Comment: occ   Drug use: No   Sexual activity: Not on file  Other Topics Concern   Not on file  Social History Narrative   Not on file   Social Drivers of Health   Financial Resource Strain: Low Risk  (07/11/2021)   Overall Financial Resource Strain (CARDIA)    Difficulty of Paying Living Expenses: Not hard at all  Food Insecurity: Low Risk  (10/13/2023)   Received from [facility]   Hunger Vital Sign    Within the past 12 months, you worried that your food would run out before you got money to buy more: Never true    Within the past 12 months, the food you bought just didn't last and you didn't have money to get more. : Never true  Transportation Needs: No Transportation Needs (10/13/2023)   Received from  [facility]   Transportation    In the past 12 months, has lack of reliable transportation kept you from medical appointments, meetings, work or from getting things needed for daily living? : No  Physical Activity: Insufficiently Active (07/11/2021)   Exercise Vital Sign    Days of Exercise per Week: 1 day    Minutes of Exercise per Session: 20 min  Stress: No Stress Concern Present (07/11/2021)   [employer] of Occupational Health - Occupational Stress Questionnaire    Feeling of Stress : Only a little  Social Connections: Unknown (07/11/2021)   Social Connection and Isolation Panel    Frequency of  Communication with Friends and Family: Three times a week    Frequency of Social Gatherings with Friends and Family: Once a week    Attends Religious Services: Patient declined    Database administrator or Organizations: No    Attends Engineer, structural: Not on file    Marital Status: Married  Catering manager Violence: Not on file    Past Surgical History:  Procedure Laterality Date   CARDIAC CATHETERIZATION  2009   COLONOSCOPY      Family History  Problem Relation Age of Onset   Colon cancer Neg Hx    Colon polyps Neg Hx    Esophageal cancer Neg Hx    Rectal cancer Neg Hx    Stomach cancer Neg Hx     Allergies  Allergen Reactions   Sulfa Antibiotics Rash    Current Outpatient Medications on File Prior to Visit  Medication Sig Dispense Refill   diclofenac  (VOLTAREN ) 50 MG EC tablet Take 1 tablet (50 mg total) by mouth 3 times/day as needed-between meals & bedtime. 60 tablet 0   rosuvastatin  (CRESTOR ) 20 MG tablet TAKE 1 TABLET BY MOUTH EVERYDAY AT BEDTIME 90 tablet 3   sertraline  (ZOLOFT ) 100 MG tablet Take 1 tablet (100 mg total) by mouth daily. 90 tablet 1   tadalafil  (CIALIS ) 20 MG tablet Take 1 tablet (20 mg total) by mouth every other day as needed for erectile dysfunction. 20 tablet 3   No current facility-administered medications on file prior to visit.    There were no vitals taken for this visit.      Objective:   Physical Exam Vitals and nursing note reviewed.  Constitutional:      General: He is not in acute distress.    Appearance: Normal appearance. He is not ill-appearing.  HENT:     Head: Normocephalic and atraumatic.     Right Ear: Tympanic membrane, ear canal and external ear normal. There is no impacted cerumen.     Left Ear: Tympanic membrane, ear canal and external ear normal. There is no impacted cerumen.     Nose: Nose normal. No congestion or rhinorrhea.     Mouth/Throat:     Mouth: Mucous membranes are moist.     Pharynx:  Oropharynx is clear.  Eyes:     Extraocular Movements: Extraocular movements intact.     Conjunctiva/sclera: Conjunctivae normal.     Pupils: Pupils are equal, round, and reactive to light.  Neck:     Vascular: No carotid bruit.  Cardiovascular:     Rate and Rhythm: Normal rate and regular rhythm.     Pulses: Normal pulses.     Heart sounds: No murmur heard.    No friction rub. No gallop.  Pulmonary:     Effort: Pulmonary effort is normal.     Breath sounds:  Normal breath sounds.  Abdominal:     General: Abdomen is flat. Bowel sounds are normal. There is no distension.     Palpations: Abdomen is soft. There is no mass.     Tenderness: There is no abdominal tenderness. There is no guarding or rebound.     Hernia: No hernia is present.  Musculoskeletal:        General: Normal range of motion.     Cervical back: Normal range of motion and neck supple.  Lymphadenopathy:     Cervical: No cervical adenopathy.  Skin:    General: Skin is warm and dry.     Capillary Refill: Capillary refill takes less than 2 seconds.  Neurological:     General: No focal deficit present.     Mental Status: He is alert and oriented to person, place, and time.  Psychiatric:        Mood and Affect: Mood normal.        Behavior: Behavior normal.        Thought Content: Thought content normal.        Judgment: Judgment normal.       Assessment & Plan:  1. Routine general medical examination at a health care facility (Primary) Today patient counseled on age appropriate routine health concerns for screening and prevention, each reviewed and up to date or declined. Immunizations reviewed and up to date or declined. Labs ordered and reviewed. Risk factors for depression reviewed and negative. Hearing function and visual acuity are intact. ADLs screened and addressed as needed. Functional ability and level of safety reviewed and appropriate. Education, counseling and referrals performed based on assessed risks  today. Patient provided with a copy of personalized plan for preventive services.   2. Mixed hyperlipidemia - Consider increase in statin  - Lipid panel; Future - TSH; Future - CBC; Future - Comprehensive metabolic panel with GFR; Future - Hemoglobin A1c; Future  3. Anxiety - Continue with Zoloft  100 mg daily  - Lipid panel; Future - TSH; Future - CBC; Future - Comprehensive metabolic panel with GFR; Future  4. Erectile dysfunction, unspecified erectile dysfunction type - Continue with Cialis  PRN  - Lipid panel; Future - TSH; Future - CBC; Future - Comprehensive metabolic panel with GFR; Future - Hemoglobin A1c; Future  5. Migraine with aura and without status migrainosus, not intractable  - Lipid panel; Future - TSH; Future - CBC; Future - Comprehensive metabolic panel with GFR; Future - diclofenac  (VOLTAREN ) 50 MG EC tablet; Take 1 tablet (50 mg total) by mouth 3 times/day as needed-between meals & bedtime.  Dispense: 180 tablet; Refill: 1  6. Prostate cancer screening  - PSA; Future  Darleene Shape, NP

## 2024-08-09 ENCOUNTER — Other Ambulatory Visit: Payer: Self-pay | Admitting: Adult Health

## 2024-08-09 DIAGNOSIS — F419 Anxiety disorder, unspecified: Secondary | ICD-10-CM
